# Patient Record
Sex: Male | Born: 1945
Health system: Southern US, Community
[De-identification: ages and names within clinical notes are randomized; demographics above are authoritative.]

## PROBLEM LIST (undated history)

## (undated) DIAGNOSIS — R112 Nausea with vomiting, unspecified: Secondary | ICD-10-CM

## (undated) DIAGNOSIS — I10 Essential (primary) hypertension: Secondary | ICD-10-CM

## (undated) DIAGNOSIS — N189 Chronic kidney disease, unspecified: Secondary | ICD-10-CM

## (undated) DIAGNOSIS — M797 Fibromyalgia: Secondary | ICD-10-CM

## (undated) DIAGNOSIS — R519 Headache, unspecified: Secondary | ICD-10-CM

## (undated) DIAGNOSIS — K219 Gastro-esophageal reflux disease without esophagitis: Secondary | ICD-10-CM

## (undated) DIAGNOSIS — J309 Allergic rhinitis, unspecified: Secondary | ICD-10-CM

## (undated) DIAGNOSIS — R51 Headache: Secondary | ICD-10-CM

## (undated) DIAGNOSIS — Z9889 Other specified postprocedural states: Secondary | ICD-10-CM

## (undated) DIAGNOSIS — M1990 Unspecified osteoarthritis, unspecified site: Secondary | ICD-10-CM

## (undated) HISTORY — PX: ADENOIDECTOMY: SUR15

## (undated) HISTORY — PX: APPENDECTOMY: SHX54

## (undated) HISTORY — PX: SINOSCOPY: SHX187

## (undated) HISTORY — PX: COLON SURGERY: SHX602

## (undated) HISTORY — PX: JOINT REPLACEMENT: SHX530

## (undated) HISTORY — PX: CERVICAL FUSION: SHX112

## (undated) HISTORY — PX: OTHER SURGICAL HISTORY: SHX169

## (undated) HISTORY — PX: CHOLECYSTECTOMY: SHX55

## (undated) HISTORY — PX: SHOULDER SURGERY: SHX246

## (undated) HISTORY — PX: TONSILLECTOMY: SUR1361

## (undated) HISTORY — DX: Allergic rhinitis, unspecified: J30.9

## (undated) HISTORY — PX: HERNIA REPAIR: SHX51

---

## 1983-01-27 HISTORY — PX: HEMORRHOID SURGERY: SHX153

## 1997-07-17 ENCOUNTER — Ambulatory Visit (HOSPITAL_COMMUNITY): Admission: RE | Admit: 1997-07-17 | Discharge: 1997-07-18 | Payer: Self-pay

## 1997-12-24 ENCOUNTER — Ambulatory Visit (HOSPITAL_COMMUNITY): Admission: RE | Admit: 1997-12-24 | Discharge: 1997-12-24 | Payer: Self-pay | Admitting: Family Medicine

## 1997-12-24 ENCOUNTER — Encounter: Payer: Self-pay | Admitting: Family Medicine

## 1999-09-25 ENCOUNTER — Encounter: Payer: Self-pay | Admitting: *Deleted

## 1999-09-25 ENCOUNTER — Encounter: Admission: RE | Admit: 1999-09-25 | Discharge: 1999-09-25 | Payer: Self-pay | Admitting: *Deleted

## 2000-06-28 ENCOUNTER — Ambulatory Visit (HOSPITAL_COMMUNITY): Admission: RE | Admit: 2000-06-28 | Discharge: 2000-06-28 | Payer: Self-pay | Admitting: Family Medicine

## 2000-06-28 ENCOUNTER — Encounter: Payer: Self-pay | Admitting: Family Medicine

## 2002-03-10 ENCOUNTER — Encounter: Payer: Self-pay | Admitting: *Deleted

## 2002-03-10 ENCOUNTER — Ambulatory Visit (HOSPITAL_COMMUNITY): Admission: RE | Admit: 2002-03-10 | Discharge: 2002-03-10 | Payer: Self-pay | Admitting: *Deleted

## 2002-03-14 ENCOUNTER — Encounter (INDEPENDENT_AMBULATORY_CARE_PROVIDER_SITE_OTHER): Payer: Self-pay | Admitting: Specialist

## 2002-03-14 ENCOUNTER — Ambulatory Visit (HOSPITAL_COMMUNITY): Admission: RE | Admit: 2002-03-14 | Discharge: 2002-03-14 | Payer: Self-pay | Admitting: Gastroenterology

## 2002-09-28 ENCOUNTER — Encounter: Payer: Self-pay | Admitting: Urology

## 2002-10-11 ENCOUNTER — Encounter (INDEPENDENT_AMBULATORY_CARE_PROVIDER_SITE_OTHER): Payer: Self-pay | Admitting: *Deleted

## 2002-10-11 ENCOUNTER — Inpatient Hospital Stay (HOSPITAL_COMMUNITY): Admission: RE | Admit: 2002-10-11 | Discharge: 2002-10-13 | Payer: Self-pay | Admitting: Urology

## 2004-01-27 HISTORY — PX: PROSTATE SURGERY: SHX751

## 2007-07-06 ENCOUNTER — Encounter: Admission: RE | Admit: 2007-07-06 | Discharge: 2007-07-06 | Payer: Self-pay | Admitting: Orthopaedic Surgery

## 2008-02-17 ENCOUNTER — Encounter: Admission: RE | Admit: 2008-02-17 | Discharge: 2008-02-17 | Payer: Self-pay | Admitting: Orthopaedic Surgery

## 2009-01-26 HISTORY — PX: MENISCUS REPAIR: SHX5179

## 2009-02-01 ENCOUNTER — Inpatient Hospital Stay (HOSPITAL_COMMUNITY): Admission: RE | Admit: 2009-02-01 | Discharge: 2009-02-06 | Payer: Self-pay | Admitting: Orthopaedic Surgery

## 2010-02-15 ENCOUNTER — Encounter: Payer: Self-pay | Admitting: Family Medicine

## 2010-04-13 LAB — URINALYSIS, ROUTINE W REFLEX MICROSCOPIC
Ketones, ur: NEGATIVE mg/dL
Nitrite: NEGATIVE
Nitrite: NEGATIVE
Specific Gravity, Urine: 1.013 (ref 1.005–1.030)
Specific Gravity, Urine: 1.02 (ref 1.005–1.030)
Urobilinogen, UA: 0.2 mg/dL (ref 0.0–1.0)
Urobilinogen, UA: 1 mg/dL (ref 0.0–1.0)
pH: 6 (ref 5.0–8.0)
pH: 7.5 (ref 5.0–8.0)

## 2010-04-13 LAB — URINE MICROSCOPIC-ADD ON

## 2010-04-13 LAB — BASIC METABOLIC PANEL
BUN: 10 mg/dL (ref 6–23)
BUN: 10 mg/dL (ref 6–23)
CO2: 29 mEq/L (ref 19–32)
Calcium: 8.3 mg/dL — ABNORMAL LOW (ref 8.4–10.5)
Calcium: 8.5 mg/dL (ref 8.4–10.5)
Creatinine, Ser: 0.92 mg/dL (ref 0.4–1.5)
Creatinine, Ser: 0.95 mg/dL (ref 0.4–1.5)
GFR calc Af Amer: >60 mL/min (ref >60)
GFR calc Af Amer: >60 mL/min (ref >60)
GFR calc non Af Amer: >60 mL/min (ref >60)
GFR calc non Af Amer: >60 mL/min (ref >60)
GFR calc non Af Amer: >60 mL/min (ref >60)
GFR calc non Af Amer: >60 mL/min (ref >60)
Glucose, Bld: 129 mg/dL — ABNORMAL HIGH (ref 70–99)
Potassium: 4.1 mEq/L (ref 3.5–5.1)
Potassium: 4.3 mEq/L (ref 3.5–5.1)
Potassium: 4.4 mEq/L (ref 3.5–5.1)
Sodium: 136 mEq/L (ref 135–145)
Sodium: 137 mEq/L (ref 135–145)

## 2010-04-13 LAB — CBC
HCT: 30.3 % — ABNORMAL LOW (ref 39.0–52.0)
HCT: 34.8 % — ABNORMAL LOW (ref 39.0–52.0)
HCT: 42.2 % (ref 39.0–52.0)
Hemoglobin: 11.2 g/dL — ABNORMAL LOW (ref 13.0–17.0)
Hemoglobin: 14.3 g/dL (ref 13.0–17.0)
MCV: 88.6 fL (ref 78.0–100.0)
Platelets: 164 10*3/uL (ref 150–400)
Platelets: 215 10*3/uL (ref 150–400)
RBC: 3.67 MIL/uL — ABNORMAL LOW (ref 4.22–5.81)
RDW: 14.3 % (ref 11.5–15.5)
WBC: 8.8 10*3/uL (ref 4.0–10.5)
WBC: 9.1 10*3/uL (ref 4.0–10.5)
WBC: 9.2 10*3/uL (ref 4.0–10.5)
WBC: 9.2 10*3/uL (ref 4.0–10.5)

## 2010-04-13 LAB — PROTIME-INR
INR: 1.18 (ref 0.00–1.49)
INR: 1.37 (ref 0.00–1.49)
INR: 1.74 — ABNORMAL HIGH (ref 0.00–1.49)
Prothrombin Time: 14.9 seconds (ref 11.6–15.2)
Prothrombin Time: 16.8 seconds — ABNORMAL HIGH (ref 11.6–15.2)
Prothrombin Time: 17.9 seconds — ABNORMAL HIGH (ref 11.6–15.2)
Prothrombin Time: 20.2 seconds — ABNORMAL HIGH (ref 11.6–15.2)

## 2010-04-13 LAB — APTT: aPTT: 30 seconds (ref 24–37)

## 2010-06-13 NOTE — Op Note (Signed)
NAME:  Oscar Ward, Oscar Ward                           ACCOUNT NO.:  [id number]   MEDICAL RECORD NO.:  [id number]                   PATIENT TYPE:  AMB   LOCATION:  ENDO                                 FACILITY:  [facility]   PHYSICIAN:  Petra Kuba, M.D.                 DATE OF BIRTH:  [birth year]   DATE OF PROCEDURE:  03/14/2002  DATE OF DISCHARGE:                                 OPERATIVE REPORT   PROCEDURE:  Colonoscopy.   INDICATIONS FOR PROCEDURE:  A patient with a history of surgery of remaining  colon, due for repeat screening.  Consent was signed after risks, benefits, methods, and options were  thoroughly discussed in the office.   MEDICINES USED:  Demerol 75 mg, Versed 6 mg.   DESCRIPTION OF PROCEDURE:  Rectal inspection was pertinent for external  hemorrhoids, small. Digital exam was negative. The pediatric video  adjustable colonoscope was inserted, easily advanced around the colon to the  cecum. This did not require any abdominal pressure or any position changes.  No obvious abnormality was seen on insertion. The scope was inserted a short  ways into the terminal ileum which was normal. Photo documentation was  obtained, the scope was slowly withdrawn. The prep was adequate. There was  some liquid stool that required washing and suctioning. On slow withdrawal  through the colon, the cecum, ascending and transverse was normal. The  surgical changes were probably in the distal sigmoid but hard to say for  sure. He had some difficulty holding air so tiny lesions on the left side  could have been missed but none were seen except for two tiny hyperplastic  appearing rectal and distal sigmoid polyps which were cold biopsied x1.  The  scope was retroflexed pertinent for some internal hemorrhoids. The scope was  straightened and readvanced a short ways up the left side of the colon, no  additional findings were seen. Air was suctioned, scope removed. The patient  tolerated the  procedure well. There was no obvious or immediate  complications.   ENDOSCOPIC DIAGNOSES:  1. Internal and external hemorrhoids.  2. Two tiny hyperplastic appearing rectal and sigmoid probably hyperplastic     polyps cold biopsied.  3. Otherwise within normal limits to the terminal ileum with difficulty     holding air with the scope on the left side.   PLAN:  Await pathology. Probably recheck colon screening in five years.  Happy to see back p.r.n., otherwise, return care to Dr. Tiburcio Pea for the  customary health care maintenance to include yearly rectals and guaiacs.                                               Petra Kuba, M.D.   MEM/MEDQ  D:  03/14/2002  T:  03/14/2002  Job:  [id number]   cc:   Melida Quitter, M.D.  [address]  [place]  [place] [address]  Fax: [phone]

## 2010-06-13 NOTE — Consult Note (Signed)
   NAME:  Oscar Ward, Oscar Ward                           ACCOUNT NO.:  [id number]   MEDICAL RECORD NO.:  [id number]                   PATIENT TYPE:  INP   LOCATION:  0003                                 FACILITY:  [facility]   PHYSICIAN:  Maretta Bees. Vonita Moss, M.D.             DATE OF BIRTH:  [birth year]   DATE OF CONSULTATION:  10/11/2002  DATE OF DISCHARGE:                                   CONSULTATION   This 65 year old white male has had a long history of voiding difficulties  with frequency and nocturia x2 and a slow stream.  He has been treated with  Flomax with some improvement and was on Cardura at some time, but he had a  paradoxical increase in his blood pressure.  He has had continued symptoms  despite medical therapy.  His prostate was not large enough to warrant  Proscar therapy, and the patient was counseled both by myself and Dr. Annabell Howells  about invasive procedures, and he opted for TUR of the prostate, having been  counseled about hemorrhage, infection, stricture formation, low risk of  incontinence, retrograde ejaculations, small risk of impotency, or  persistent symptomatology.  He was told about a 2-day hospital stay in all  likelihood.   PAST HISTORY:   PAST MEDICAL HISTORY:  1. A solitary left kidney.  2. He has had migraine headaches.   PAST SURGICAL HISTORY:  1. Hemorrhoidectomy.  2. Tonsillectomy.  3. Hernia repair.  4. Partial colectomy for megacolon.  5. Right orchiectomy.  6. Ear surgery.  7. Nasal surgery.   MEDICATIONS:  1. Vioxx 25 mg daily.  2. Lovastatin 20 mg daily.  3. Isosit p.r.n. migraine headaches.  4. He just finished a Z-Pak for bronchitis.   ALLERGIES:  HE CANNOT TOLERATE CODEINE BECAUSE OF CONSTIPATION.   SOCIAL HISTORY:  He does not smoke or drink alcohol.   FAMILY HISTORY:  Noncontributory.   REVIEW OF SYSTEMS:  As noted.   PHYSICAL EXAMINATION:  VITAL SIGNS:  Blood pressure 164/96, pulse 68,  temperature 98.3.  GENERAL:  He is alert and  oriented.  SKIN:  Warm and dry.  CHEST:  Clear.  ABDOMEN:  Soft and nontender.  EXTERNAL GENITALIA:  Absent right testicle.  RECTAL:  Prostate feels small and benign.   IMPRESSION:  1. Benign prostatic hypertrophy and bladder outlet obstruction symptoms.  2. Migraine headaches.  3. Hypercholesterolemia.   PLAN:  TUR the prostate.                                                 Maretta Bees. Vonita Moss, M.D.    LJP/MEDQ  D:  10/11/2002  T:  10/11/2002  Job:  [id number]

## 2010-06-13 NOTE — Discharge Summary (Signed)
   NAME:  Oscar Ward, Oscar Ward                           ACCOUNT NO.:  [id number]   MEDICAL RECORD NO.:  [id number]                   PATIENT TYPE:  INP   LOCATION:  0345                                 FACILITY:  [facility]   PHYSICIAN:  Maretta Bees. Vonita Moss, M.D.             DATE OF BIRTH:  [birth year]   DATE OF ADMISSION:  10/11/2002  DATE OF DISCHARGE:  10/13/2002                                 DISCHARGE SUMMARY   FINAL DIAGNOSES:  1. Benign prostatic hypertrophy and bladder outlet obstructive symptoms.  2. Migraine headaches.  3. Hypercholesterolemia.   PROCEDURE:  Transurethral resection of prostate on October 11, 2002.   HISTORY:  This is a 65 year old white male who has had a long history of  bladder outlet obstructive symptoms.  He was treated with Flomax with some  improvement, and has been previously treated with Cardura with paradoxical  improvement in his blood pressure.  His prostate was very small __________  candidate for Proscar therapy.  __________ with Coreg.  Dr. __________ and I  both recommended TUR of the prostate, trying to relieve the symptoms.   In general good health.  He does have a solitary left kidney.  No __________  headache and hypercholesterolemia.   PHYSICAL EXAMINATION:  Noncontributory.   HOSPITAL COURSE:  After admission, he underwent TUR of the prostate.  __________ benign.  His urine remained clear.  His Foley was removed on  October 13, 2002.  Final pathology showed no cancer tissue.  Voiding with  slight dysuria and some frequency, but felt like he emptied his bladder well  on the day of discharge.  He will go home and continue on his usual  medications of Vioxx, Ultracet, and migraine therapy.  He was sent home in  good condition.   He will return to the office in two weeks for followup.  He is on light  activities for a month, and he was advised about the possibility of  hematuria and  __________ .       Maretta Bees. Vonita Moss, M.D.    LJP/MEDQ  D:  10/13/2002  T:  10/13/2002  Job:  [id number]

## 2010-06-13 NOTE — Op Note (Signed)
   NAME:  Oscar Ward, Oscar Ward                           ACCOUNT NO.:  [id number]   MEDICAL RECORD NO.:  [id number]                   PATIENT TYPE:  INP   LOCATION:  0003                                 FACILITY:  [facility]   PHYSICIAN:  Maretta Bees. Vonita Moss, M.D.             DATE OF BIRTH:  1945/08/17   DATE OF PROCEDURE:  10/11/2002  DATE OF DISCHARGE:                                 OPERATIVE REPORT   PREOPERATIVE DIAGNOSIS:  Benign prostatic hypertrophy and prostatism.   POSTOPERATIVE DIAGNOSIS:  Benign prostatic hypertrophy and prostatism.   OPERATION/PROCEDURE:  Transurethral resection of the prostate.   SURGEON:  Maretta Bees. Vonita Moss, M.D.   ANESTHESIA:  General.   INDICATIONS:  This 65 year old white male has had a long history of voiding  symptoms unresponsive to medical therapy.  His prostate is small and would  not benefit by Proscar.  He was counseled about invasive procedures and he  chose TUR of the prostate.  He had a flow rate showing low flow and he does  have a history of a solitary left kidney.   DESCRIPTION OF PROCEDURE:  The patient was brought to the operating room,  placed in the lithotomy position.  External genitalia prepped and draped in  the usual fashion.  The #24 through 30-French [employer] sounds were passed  to the bladder.  He had a very mild meatal stenosis.  The 28-French  resectoscope sheath was inserted.  The bladder had mild trabeculation and a  solitary left ureteral orifice.  He had a very high bladder neck and small  lateral lobes.  The bladder neck was resected first which brought it wide  open when he was resected at 6 o'clock.  The lateral lobe tissue was  resected down the capsule.  This created a well excavated, open prostate.  He had very minimal bleeding.  Hemostasis was obtained by use of  electrocautery.  Residual chips were removed from the bladder at this point.  There was good hemostasis and well excavated prostatic fossa and the  solitary left  ureteral orifice was intact.  The scope was removed.  External  sphincter was seen to be intact.  The 24-French, 30 mL Foley was inserted  and connected to closed drainage with clear irrigation.  He was taken to the  recovery room in good condition.                                                 Maretta Bees. Vonita Moss, M.D.    LJP/MEDQ  D:  10/11/2002  T:  10/11/2002  Job:  [id number]

## 2012-02-12 ENCOUNTER — Other Ambulatory Visit (HOSPITAL_COMMUNITY): Payer: Self-pay | Admitting: Family Medicine

## 2012-02-12 ENCOUNTER — Ambulatory Visit (HOSPITAL_COMMUNITY)
Admission: RE | Admit: 2012-02-12 | Discharge: 2012-02-12 | Disposition: A | Discharge status: Home / Self Care | Payer: BC Managed Care – PPO | Source: Ambulatory Visit | Attending: Family Medicine | Admitting: Family Medicine

## 2012-02-12 DIAGNOSIS — R52 Pain, unspecified: Secondary | ICD-10-CM

## 2012-02-12 DIAGNOSIS — M7989 Other specified soft tissue disorders: Secondary | ICD-10-CM | POA: Insufficient documentation

## 2012-02-12 DIAGNOSIS — M79609 Pain in unspecified limb: Secondary | ICD-10-CM | POA: Insufficient documentation

## 2012-02-12 DIAGNOSIS — R609 Edema, unspecified: Secondary | ICD-10-CM

## 2012-02-12 NOTE — Progress Notes (Signed)
VASCULAR LAB PRELIMINARY  PRELIMINARY  PRELIMINARY  PRELIMINARY  Left lower extremity venous duplex completed.    Preliminary report:  Left:  No evidence of DVT, superficial thrombosis, or Baker's cyst.  Trace amounts of interstitial fluid noted in the posterior distal thigh at the area of most pain.  Davison Ohms, RVT 02/12/2012, 2:18 PM

## 2013-05-31 ENCOUNTER — Other Ambulatory Visit (HOSPITAL_COMMUNITY): Payer: Self-pay | Admitting: Family Medicine

## 2013-05-31 DIAGNOSIS — R0602 Shortness of breath: Secondary | ICD-10-CM

## 2013-06-15 ENCOUNTER — Telehealth (HOSPITAL_COMMUNITY): Payer: Self-pay

## 2013-06-16 ENCOUNTER — Ambulatory Visit (HOSPITAL_COMMUNITY)
Admission: RE | Admit: 2013-06-16 | Discharge: 2013-06-16 | Disposition: A | Discharge status: Home / Self Care | Payer: 59 | Source: Ambulatory Visit | Attending: Cardiovascular Disease | Admitting: Cardiovascular Disease

## 2013-06-16 DIAGNOSIS — R0602 Shortness of breath: Secondary | ICD-10-CM

## 2013-06-21 ENCOUNTER — Encounter (HOSPITAL_COMMUNITY): Payer: BC Managed Care – PPO

## 2013-08-03 NOTE — Telephone Encounter (Signed)
Encounter complete. 

## 2014-11-15 ENCOUNTER — Encounter: Payer: Self-pay | Admitting: Allergy and Immunology

## 2014-11-15 ENCOUNTER — Ambulatory Visit (INDEPENDENT_AMBULATORY_CARE_PROVIDER_SITE_OTHER): Payer: Medicare Other | Admitting: Allergy and Immunology

## 2014-11-15 VITALS — Ht 71.5 in | Wt 189.6 lb

## 2014-11-15 DIAGNOSIS — G4489 Other headache syndrome: Secondary | ICD-10-CM

## 2014-11-15 DIAGNOSIS — J387 Other diseases of larynx: Secondary | ICD-10-CM

## 2014-11-15 DIAGNOSIS — K219 Gastro-esophageal reflux disease without esophagitis: Secondary | ICD-10-CM

## 2014-11-15 DIAGNOSIS — J3089 Other allergic rhinitis: Secondary | ICD-10-CM

## 2014-11-15 NOTE — Patient Instructions (Signed)
  1. Continue montelukast 10mg  and OTC Rhinocort  2. Continue Pantoprazole and Ranitidine  3. Continue nasal saline and cetirizine if needed  4. Return in 8 weeks

## 2014-11-15 NOTE — Progress Notes (Signed)
[facility] [place] [place]  Follow-up Note    Subjective:   Oscar CharonBennie F Tesler Jr. is a 69 y.o. male who [facility] to the [facility] in re-evaluation of the following:  HPI Comments:  Oscar Ward returns to this clinic in reevaluation of his allergic rhinitis and gastric reflux disease and cephalgia. He is resolved off his issues. His throat is a lot better. His nose is lot better. He has no reflux. He has no headache. He's been consistently using montelukast and Rhinocort. He is also continued to use  pantoprazole and ranitidine. He is very satisfied with the response he is received.    Current outpatient prescriptions:  .  atorvastatin (LIPITOR) 20 MG tablet, Take 20 mg by mouth daily., Disp: , Rfl: 8 .  budesonide (RHINOCORT ALLERGY) 32 MCG/ACT nasal spray, Place 1 spray into both nostrils daily., Disp: , Rfl:  .  cetirizine (ZYRTEC) 10 MG tablet, Take 10 mg by mouth daily., Disp: , Rfl:  .  lisinopril (PRINIVIL,ZESTRIL) 10 MG tablet, Take 10 mg by mouth daily., Disp: , Rfl: 1 .  meloxicam (MOBIC) 15 MG tablet, Take 15 mg by mouth daily., Disp: , Rfl: 5 .  montelukast (SINGULAIR) 10 MG tablet, Take 10 mg by mouth daily., Disp: , Rfl: 3 .  oxyCODONE-acetaminophen (PERCOCET) 7.5-325 MG tablet, Take 325 tablets by mouth daily., Disp: , Rfl: 0 .  pantoprazole (PROTONIX) 40 MG tablet, Take 40 mg by mouth daily., Disp: , Rfl: 5 .  ranitidine (ZANTAC) 300 MG tablet, Take 300 mg by mouth every evening., Disp: , Rfl: 1 .  senna (SENOKOT) 8.6 MG tablet, Take 1 tablet by mouth daily., Disp: , Rfl:   No orders of the defined types were placed in this encounter.    No past medical history on file.  Past Surgical History  Procedure Laterality Date  . Adenoidectomy    . Tonsillectomy    . Sinoscopy      Allergies no known allergies  Review of Systems  Constitutional: Negative.   HENT: Negative.   Eyes: Negative.   Respiratory: Negative.    Cardiovascular: Negative.   Gastrointestinal: Negative.   Skin: Negative.      Objective:   There were no vitals filed for this visit.  Physical Exam  Diagnostics: None   Assessment and Plan:   1. Other allergic rhinitis   2. LPRD (laryngopharyngeal reflux disease)   3. Other headache syndrome     1. Continue montelukast 10mg  and OTC Rhinocort  2. Continue Pantoprazole and Ranitidine  3. Continue nasal saline and cetirizine if needed  4. Return in 8 weeks  Overall Oscar Ward has done great. We'll keep him on this plan for a full 12 weeks. At that point in time we will see if we can consolidate his treatment.     Oscar SchimkeEric Kozlow, MD [facility]

## 2014-12-06 ENCOUNTER — Other Ambulatory Visit: Payer: Self-pay

## 2014-12-06 MED ORDER — RANITIDINE HCL 300 MG PO TABS: 300.0000 mg | ORAL_TABLET | Freq: Every evening | ORAL | Status: DC

## 2015-01-10 ENCOUNTER — Ambulatory Visit (INDEPENDENT_AMBULATORY_CARE_PROVIDER_SITE_OTHER): Payer: Medicare Other | Admitting: Allergy and Immunology

## 2015-01-10 ENCOUNTER — Encounter: Payer: Self-pay | Admitting: Allergy and Immunology

## 2015-01-10 VITALS — BP 110/60 | HR 64 | Resp 16

## 2015-01-10 DIAGNOSIS — H1010 Acute atopic conjunctivitis, unspecified eye: Secondary | ICD-10-CM | POA: Diagnosis not present

## 2015-01-10 DIAGNOSIS — G4489 Other headache syndrome: Secondary | ICD-10-CM | POA: Insufficient documentation

## 2015-01-10 DIAGNOSIS — J387 Other diseases of larynx: Secondary | ICD-10-CM

## 2015-01-10 DIAGNOSIS — J309 Allergic rhinitis, unspecified: Secondary | ICD-10-CM

## 2015-01-10 DIAGNOSIS — K219 Gastro-esophageal reflux disease without esophagitis: Secondary | ICD-10-CM | POA: Insufficient documentation

## 2015-01-10 NOTE — Progress Notes (Signed)
[facility] [place]  Follow-up Note  Refering Provider: Johny Blamer, MD Primary Provider: Johny Blamer, MD  Subjective:   Oscar Collard. is a 69 y.o. male who returns to the [facility] in re-evaluation of the following:  HPI Comments:  Oscar Ward returns to this clinic on 01/10/2015 in evaluation of his allergic rhinitis, LPR, and headache. While consistently using montelukast and Rhinocort he has not had any problems with his upper airways. While consistently using pantoprazole and ranitidine he has not had any problems with reflux or problems with his throat. However, he has once again redeveloped headaches with a frequency of twice a week. He relies on the use of Fioricet and Mucinex D to alleviate these headaches twice a week. He does continue to drink caffeine. He drinks 2 cups of coffee per day. He apparently had a head CT scan at [facility] the past month which was normal.   Outpatient Encounter Prescriptions as of 01/10/2015  Medication Sig  . atorvastatin (LIPITOR) 20 MG tablet Take 20 mg by mouth daily.  . budesonide (RHINOCORT ALLERGY) 32 MCG/ACT nasal spray Place 1 spray into both nostrils daily.  . cetirizine (ZYRTEC) 10 MG tablet Take 10 mg by mouth daily.  Marland Kitchen lisinopril (PRINIVIL,ZESTRIL) 10 MG tablet Take 10 mg by mouth daily.  . meloxicam (MOBIC) 15 MG tablet Take 15 mg by mouth daily.  . montelukast (SINGULAIR) 10 MG tablet Take 10 mg by mouth daily.  . OIL OF OREGANO PO Take 1 tablet by mouth daily.  Marland Kitchen oxyCODONE-acetaminophen (PERCOCET) 7.5-325 MG tablet Take 325 tablets by mouth daily.  . pantoprazole (PROTONIX) 40 MG tablet Take 40 mg by mouth daily.  . ranitidine (ZANTAC) 300 MG tablet Take 1 tablet (300 mg total) by mouth every evening.  . senna (SENOKOT) 8.6 MG tablet Take 1 tablet by mouth daily.   No facility-administered encounter medications on file as of 01/10/2015.    No orders  of the defined types were placed in this encounter.    History reviewed. No pertinent past medical history.  Past Surgical History  Procedure Laterality Date  . Adenoidectomy    . Tonsillectomy    . Sinoscopy      No Known Allergies  Review of Systems  Constitutional: Negative for fever, chills and fatigue.  HENT: Negative for congestion, ear pain, facial swelling, hearing loss, nosebleeds, postnasal drip, rhinorrhea, sinus pressure, sneezing, sore throat, tinnitus, trouble swallowing and voice change.   Eyes: Negative for pain, discharge, redness and itching.  Respiratory: Negative for cough, chest tightness, shortness of breath and wheezing.   Cardiovascular: Negative for chest pain and leg swelling.  Gastrointestinal: Negative for nausea, vomiting and abdominal pain.  Musculoskeletal: Negative for myalgias and arthralgias.  Skin: Negative for rash.  Allergic/Immunologic: Negative.   Neurological: Positive for headaches. Negative for dizziness.  Hematological: Negative for adenopathy. Does not bruise/bleed easily.     Objective:   Filed Vitals:   01/10/15 1006  BP: 110/60  Pulse: 64  Resp: 16          Physical Exam  Constitutional: He appears well-developed and well-nourished. No distress.  HENT:  Head: Normocephalic and atraumatic. Head is without right periorbital erythema and without left periorbital erythema.  Right Ear: Tympanic membrane, external ear and ear canal normal. No drainage or tenderness. No foreign bodies. Tympanic membrane is not injected, not scarred, not perforated, not erythematous, not retracted and not bulging. No middle  ear effusion.  Left Ear: Tympanic membrane, external ear and ear canal normal. No drainage or tenderness. No foreign bodies. Tympanic membrane is not injected, not scarred, not perforated, not erythematous, not retracted and not bulging.  No middle ear effusion.  Nose: Nose normal. No mucosal edema, rhinorrhea, nose lacerations  or sinus tenderness.  No foreign bodies.  Mouth/Throat: Oropharynx is clear and moist. No oropharyngeal exudate, posterior oropharyngeal edema, posterior oropharyngeal erythema or tonsillar abscesses.  Bilateral hearing aids  Eyes: Lids are normal. Right eye exhibits no chemosis, no discharge and no exudate. No foreign body present in the right eye. Left eye exhibits no chemosis, no discharge and no exudate. No foreign body present in the left eye. Right conjunctiva is not injected. Left conjunctiva is not injected.  Neck: Neck supple. No tracheal tenderness present. No tracheal deviation and no edema present. No thyroid mass and no thyromegaly present.  Cardiovascular: Normal rate, regular rhythm, S1 normal and S2 normal.  Exam reveals no gallop.   No murmur heard. Pulmonary/Chest: No accessory muscle usage or stridor. No respiratory distress. He has no wheezes. He has no rhonchi. He has no rales.  Abdominal: Soft.  Lymphadenopathy:       Head (right side): No tonsillar adenopathy present.       Head (left side): No tonsillar adenopathy present.    He has no cervical adenopathy.  Neurological: He is alert.  Skin: No rash noted. He is not diaphoretic.  Psychiatric: He has a normal mood and affect. His behavior is normal.    Diagnostics: None  Assessment and Plan:   1. Allergic rhinoconjunctivitis   2. LPRD (laryngopharyngeal reflux disease)   3. Other headache syndrome      1. Continue montelukast 10mg  daily and OTC Rhinocort 3-7 times per week.  2. Continue Pantoprazole and Ranitidine  3. Continue nasal saline and cetirizine if needed  4. Slowly taper off all caffeine and chocolate  5. [facility]  6. Return in 6 months or earlier if problem  I will have Neils slowly taper off his caffeine in the hope of minimizing his headaches in the future. He will continue to use pantoprazole and ranitidine as well as montelukast and Rhinocort for an additional  6 months. We'll see what happens as we progress through this upcoming springtime season and we'll make a decision about how to proceed pending his response to medical therapy described above. Certainly if his headaches increase in frequency or intensity he'll require further evaluation.     Laurette SchimkeEric Kozlow, MD [facility]

## 2015-01-10 NOTE — Patient Instructions (Signed)
  1. Continue montelukast 10mg  daily and OTC Rhinocort 3-7 times per week.  2. Continue Pantoprazole and Ranitidine  3. Continue nasal saline and cetirizine if needed  4. Slowly taper off all caffeine and chocolate  5. [facility]  6. Return in 6 months or earlier if problem

## 2015-04-24 ENCOUNTER — Other Ambulatory Visit: Payer: Self-pay | Admitting: *Deleted

## 2015-04-24 ENCOUNTER — Telehealth: Payer: Self-pay | Admitting: Allergy and Immunology

## 2015-04-24 MED ORDER — AZELASTINE HCL 0.1 % NA SOLN: NASAL | Status: DC

## 2015-04-24 NOTE — Telephone Encounter (Signed)
Can add Azelastine 1-2 sprays each nostril one -two times per day prn

## 2015-04-24 NOTE — Telephone Encounter (Signed)
Having trouble with pollen. The medication he is currently taken is not helping. Can something be called in or should he come in for an appointment?  [facility] in [place].

## 2015-04-24 NOTE — Telephone Encounter (Signed)
[facility]

## 2015-05-04 ENCOUNTER — Other Ambulatory Visit: Payer: Self-pay | Admitting: Allergy and Immunology

## 2015-05-23 ENCOUNTER — Other Ambulatory Visit: Payer: Self-pay | Admitting: Allergy and Immunology

## 2015-07-11 ENCOUNTER — Encounter: Payer: Self-pay | Admitting: Allergy and Immunology

## 2015-07-11 ENCOUNTER — Ambulatory Visit (INDEPENDENT_AMBULATORY_CARE_PROVIDER_SITE_OTHER): Payer: Medicare Other | Admitting: Allergy and Immunology

## 2015-07-11 VITALS — BP 120/86 | HR 80 | Resp 16

## 2015-07-11 DIAGNOSIS — G4489 Other headache syndrome: Secondary | ICD-10-CM | POA: Diagnosis not present

## 2015-07-11 DIAGNOSIS — J309 Allergic rhinitis, unspecified: Secondary | ICD-10-CM

## 2015-07-11 DIAGNOSIS — H1010 Acute atopic conjunctivitis, unspecified eye: Secondary | ICD-10-CM

## 2015-07-11 DIAGNOSIS — K219 Gastro-esophageal reflux disease without esophagitis: Secondary | ICD-10-CM

## 2015-07-11 DIAGNOSIS — J387 Other diseases of larynx: Secondary | ICD-10-CM | POA: Diagnosis not present

## 2015-07-11 NOTE — Patient Instructions (Addendum)
  1. Continue montelukast 10mg  daily and Astelin 2 sprays each nostril twice a day  2. Continue Pantoprazole and Ranitidine  3. Continue nasal saline and cetirizine if needed  4. Continue to remain off all caffeine and chocolate  5. [facility]  6. Return in 6 months or earlier if problem

## 2015-07-11 NOTE — Progress Notes (Signed)
Follow-up Note  Referring Provider: Johny BlamerHarris, William, MD Primary Provider: Johny BlamerHARRIS, WILLIAM, MD Date of Office Visit: 07/11/2015  Subjective:   Oscar CharonBennie F Filter Jr. (DOB: 05/31/1945) is a 70 y.o. male who [facility] in re-evaluation of the following:  HPI: Oscar Ward returns to this clinic in reevaluation of his allergic rhinitis and LPR and headaches. I've not seen him in his clinic since December 2016.  His upper airways are doing quite well now with a combination of nasal antihistamine and montelukast. He has not had to use an antibiotic to treat an episode of sinusitis although he did have one event when he was visiting in [place] and developed an episode of sinusitis. Apparently he had coughing with that episode and a chest x-ray was obtained which identified an abnormality and he had a follow-up CT scan which may have identified a nodule and he was evaluated by a pulmonologist who felt that he just needed to have a repeat CT scan in one year.  His reflux and throat issue are doing very well as long as he continues to use treatment for his reflux which includes at this point in time a proton pump inhibitor in the morning and an H2 receptor blocker in the evening. He does not drink any caffeine or eat any chocolate. He has elevated the head of his bed.  His headaches are still an issue as they have been for many many years. He still has a frequency of twice a week for whichshe will use Fioricet.    Medication List           atorvastatin 20 MG tablet  Commonly known as:  LIPITOR  Take 20 mg by mouth daily.     azelastine 0.1 % nasal spray  Commonly known as:  ASTELIN  USE ONE-TWO SPRAYS IN EACH NOSTRIL ONCE OR TWICE DAILY AS NEEDED     butalbital-acetaminophen-caffeine 50-325-40 MG tablet  Commonly known as:  FIORICET, ESGIC  Take 1 tablet by mouth as needed.     cetirizine 10 MG tablet  Commonly known as:  ZYRTEC  Take 10 mg by  mouth daily.     HYDROcodone-acetaminophen 10-325 MG tablet  Commonly known as:  NORCO  Take 1 tablet by mouth as needed.     losartan 50 MG tablet  Commonly known as:  COZAAR  Take 2 tablets by mouth daily.     meloxicam 15 MG tablet  Commonly known as:  MOBIC  Take 15 mg by mouth daily.     montelukast 10 MG tablet  Commonly known as:  SINGULAIR  Take 10 mg by mouth daily.     OIL OF OREGANO PO  Take 1 tablet by mouth daily.     ranitidine 300 MG tablet  Commonly known as:  ZANTAC  TAKE 1 TABLET BY MOUTH EVERY EVENING     senna 8.6 MG tablet  Commonly known as:  SENOKOT  Take 1 tablet by mouth daily.        History reviewed. No pertinent past medical history.  Past Surgical History  Procedure Laterality Date  . Adenoidectomy    . Tonsillectomy    . Sinoscopy      No Known Allergies  Review of systems negative except as noted in HPI / PMHx or noted below:  Review of Systems  Constitutional: Negative.   HENT: Negative.   Eyes: Negative.   Respiratory: Negative.   Cardiovascular: Negative.  Gastrointestinal: Negative.   Genitourinary: Negative.   Musculoskeletal: Negative.   Skin: Negative.   Neurological: Negative.   Endo/Heme/Allergies: Negative.   Psychiatric/Behavioral: Negative.      Objective:   Filed Vitals:   07/11/15 1056  BP: 120/86  Pulse: 80  Resp: 16          Physical Exam  Constitutional: He is well-developed, well-nourished, and in no distress.  HENT:  Head: Normocephalic.  Right Ear: Tympanic membrane, external ear and ear canal normal.  Left Ear: Tympanic membrane, external ear and ear canal normal.  Nose: Nose normal. No mucosal edema or rhinorrhea.  Mouth/Throat: Uvula is midline, oropharynx is clear and moist and mucous membranes are normal. No oropharyngeal exudate.  Eyes: Conjunctivae are normal.  Neck: Trachea normal. No tracheal tenderness present. No tracheal deviation present. No thyromegaly present.    Cardiovascular: Normal rate, regular rhythm, S1 normal, S2 normal and normal heart sounds.   No murmur heard. Pulmonary/Chest: Breath sounds normal. No stridor. No respiratory distress. He has no wheezes. He has no rales.  Musculoskeletal: He exhibits no edema.  Lymphadenopathy:       Head (right side): No tonsillar adenopathy present.       Head (left side): No tonsillar adenopathy present.    He has no cervical adenopathy.  Neurological: He is alert. Gait normal.  Skin: No rash noted. He is not diaphoretic. No erythema. Nails show no clubbing.  Psychiatric: Mood and affect normal.    Diagnostics: None    Assessment and Plan:   1. Allergic rhinoconjunctivitis   2. LPRD (laryngopharyngeal reflux disease)   3. Other headache syndrome     1. Continue montelukast  daily and OTC Astelin 2 sprays each nostril twice a day.  2. Continue Pantoprazole and Ranitidine  3. Continue nasal saline and cetirizine if needed  4. Continue to remain off all caffeine and chocolate  5. Review head CT from [facility]  6. Return in 6 months or earlier if problem  Audelia Acton appears to be doing relatively well and I'm not really going to change much of his medical therapy at this point in time. He will continue to use therapy directed against inflammation of his upper airway and continued to use therapy directed against reflux. I've asked him to follow-up with his primary care doctor if he would like any additional evaluation or therapy regarding his headaches. This appears to be a long-standing issue and he's been using Fioricet for many many years and he is very much satisfied with the response that he received on this form of therapy.  Laurette Schimke, MD [facility]

## 2015-08-15 ENCOUNTER — Other Ambulatory Visit: Payer: Self-pay | Admitting: Allergy and Immunology

## 2015-10-05 ENCOUNTER — Other Ambulatory Visit: Payer: Self-pay | Admitting: Allergy and Immunology

## 2015-10-07 ENCOUNTER — Other Ambulatory Visit: Payer: Self-pay | Admitting: Physician Assistant

## 2015-10-08 ENCOUNTER — Encounter (HOSPITAL_COMMUNITY)
Admission: RE | Admit: 2015-10-08 | Discharge: 2015-10-08 | Disposition: A | Discharge status: Home / Self Care | Payer: Medicare Other | Source: Ambulatory Visit | Attending: Orthopaedic Surgery | Admitting: Orthopaedic Surgery

## 2015-10-08 ENCOUNTER — Other Ambulatory Visit (HOSPITAL_COMMUNITY): Payer: Self-pay | Admitting: *Deleted

## 2015-10-08 ENCOUNTER — Encounter (HOSPITAL_COMMUNITY): Payer: Self-pay

## 2015-10-08 DIAGNOSIS — Z01818 Encounter for other preprocedural examination: Secondary | ICD-10-CM | POA: Diagnosis not present

## 2015-10-08 HISTORY — DX: Headache, unspecified: R51.9

## 2015-10-08 HISTORY — DX: Gastro-esophageal reflux disease without esophagitis: K21.9

## 2015-10-08 HISTORY — DX: Chronic kidney disease, unspecified: N18.9

## 2015-10-08 HISTORY — DX: Headache: R51

## 2015-10-08 HISTORY — DX: Nausea with vomiting, unspecified: R11.2

## 2015-10-08 HISTORY — DX: Fibromyalgia: M79.7

## 2015-10-08 HISTORY — DX: Essential (primary) hypertension: I10

## 2015-10-08 HISTORY — DX: Other specified postprocedural states: Z98.890

## 2015-10-08 HISTORY — DX: Unspecified osteoarthritis, unspecified site: M19.90

## 2015-10-08 LAB — BASIC METABOLIC PANEL
Anion gap: 7 (ref 5–15)
BUN: 11 mg/dL (ref 6–20)
CALCIUM: 9.3 mg/dL (ref 8.9–10.3)
CO2: 29 mmol/L (ref 22–32)
CREATININE: 1.49 mg/dL — AB (ref 0.61–1.24)
Chloride: 104 mmol/L (ref 101–111)
GFR, EST AFRICAN AMERICAN: 53 mL/min — AB (ref >60)
GFR, EST NON AFRICAN AMERICAN: 45 mL/min — AB (ref >60)
Glucose, Bld: 106 mg/dL — ABNORMAL HIGH (ref 65–99)
Potassium: 3.9 mmol/L (ref 3.5–5.1)
SODIUM: 140 mmol/L (ref 135–145)

## 2015-10-08 LAB — CBC
HCT: 40.3 % (ref 39.0–52.0)
Hemoglobin: 12.3 g/dL — ABNORMAL LOW (ref 13.0–17.0)
MCH: 24.9 pg — ABNORMAL LOW (ref 26.0–34.0)
MCHC: 30.5 g/dL (ref 30.0–36.0)
MCV: 81.6 fL (ref 78.0–100.0)
PLATELETS: 271 10*3/uL (ref 150–400)
RBC: 4.94 MIL/uL (ref 4.22–5.81)
RDW: 15.7 % — AB (ref 11.5–15.5)
WBC: 6.4 10*3/uL (ref 4.0–10.5)

## 2015-10-08 LAB — TYPE AND SCREEN
ABO/RH(D): O POS
Antibody Screen: NEGATIVE

## 2015-10-08 LAB — ABO/RH: ABO/RH(D): O POS

## 2015-10-08 LAB — SURGICAL PCR SCREEN
MRSA, PCR: NEGATIVE
STAPHYLOCOCCUS AUREUS: NEGATIVE

## 2015-10-08 NOTE — Pre-Procedure Instructions (Signed)
    Oscar CritchleyBennie F Calkin Jr.  10/08/2015      [facility] [phone] - [place], [place] - [address] AT [facility] [address] [place] [place] [address] Phone: [phone] Fax: [phone]    Your procedure is scheduled on 10-15-2015  Tuesday   [facility] Admitting at 1:00 PM    Call this number if you have problems the morning of surgery:  [phone]   Remember:  Do not eat food or drink liquids after midnight.   Take these medicines the morning of surgery with A SIP OF WATER Astelin nasal spray as needed,Fioricet if needed for headache,Hydrocodone(Norco) ,Protonix           STOP ASPIRIN,ANTIINFLAMATORIES (IBUPROFEN,ALEVE,MOTRIN,ADVIL,GOODY'S POWDERS),HERBAL SUPPLEMENTS,FISH OIL,AND VITAMINS 5-7 DAYS PRIOR TO SURGERY   Do not wear jewelry,   Do not wear lotions, powders, or perfumes, or deoderant.  Do not shave 48 hours prior to surgery.  Men may shave face and neck   Do not bring valuables to the hospital.  [facility] is not responsible for any belongings or valuables.  Contacts, dentures or bridgework may not be worn into surgery.  Leave your suitcase in the car.  After surgery it may be brought to your room.  For patients admitted to the hospital, discharge time will be determined by your treatment team.  Patients discharged the day of surgery will not be allowed to drive home.    Special instructions:  See attached Sheet for instructions on CHG showers  Please read over the following fact sheets that you were given. Coughing and Deep Breathing

## 2015-10-08 NOTE — Progress Notes (Signed)
[facility]  Requested Stress Test ,Last OV and Chest X-Ray from Dr. Durwin Noraandal Harris.

## 2015-10-14 MED ORDER — TRANEXAMIC ACID 1000 MG/10ML IV SOLN
1000.0000 mg | INTRAVENOUS | Status: AC
  Administered 2015-10-15: 1000 mg via INTRAVENOUS
  Filled 2015-10-14: qty 10

## 2015-10-14 MED ORDER — CEFAZOLIN SODIUM-DEXTROSE 2-4 GM/100ML-% IV SOLN
2.0000 g | INTRAVENOUS | Status: AC
  Administered 2015-10-15: 2 g via INTRAVENOUS
  Filled 2015-10-14: qty 100

## 2015-10-14 NOTE — Progress Notes (Signed)
Pt notified of time change.New arrival of 11:30.Pt verbalized understanding.

## 2015-10-15 ENCOUNTER — Encounter (HOSPITAL_COMMUNITY): Payer: Self-pay | Admitting: *Deleted

## 2015-10-15 ENCOUNTER — Inpatient Hospital Stay (HOSPITAL_COMMUNITY): Payer: Medicare Other | Admitting: Certified Registered"

## 2015-10-15 ENCOUNTER — Inpatient Hospital Stay (HOSPITAL_COMMUNITY): Payer: Medicare Other

## 2015-10-15 ENCOUNTER — Inpatient Hospital Stay (HOSPITAL_COMMUNITY)
Admission: RE | Admit: 2015-10-15 | Discharge: 2015-10-17 | DRG: 465 | Disposition: A | Discharge status: Home Health | Payer: Medicare Other | Source: Ambulatory Visit | Attending: Orthopaedic Surgery | Admitting: Orthopaedic Surgery

## 2015-10-15 ENCOUNTER — Encounter (HOSPITAL_COMMUNITY)
Admission: RE | Disposition: A | Discharge status: Home Health | Payer: Self-pay | Source: Ambulatory Visit | Attending: Orthopaedic Surgery

## 2015-10-15 DIAGNOSIS — N189 Chronic kidney disease, unspecified: Secondary | ICD-10-CM | POA: Diagnosis present

## 2015-10-15 DIAGNOSIS — I129 Hypertensive chronic kidney disease with stage 1 through stage 4 chronic kidney disease, or unspecified chronic kidney disease: Secondary | ICD-10-CM | POA: Diagnosis present

## 2015-10-15 DIAGNOSIS — T84068A Wear of articular bearing surface of other internal prosthetic joint, initial encounter: Secondary | ICD-10-CM

## 2015-10-15 DIAGNOSIS — T84033A Mechanical loosening of internal left knee prosthetic joint, initial encounter: Secondary | ICD-10-CM | POA: Diagnosis present

## 2015-10-15 DIAGNOSIS — K219 Gastro-esophageal reflux disease without esophagitis: Secondary | ICD-10-CM | POA: Diagnosis present

## 2015-10-15 DIAGNOSIS — M797 Fibromyalgia: Secondary | ICD-10-CM | POA: Diagnosis present

## 2015-10-15 DIAGNOSIS — M4322 Fusion of spine, cervical region: Secondary | ICD-10-CM | POA: Diagnosis present

## 2015-10-15 DIAGNOSIS — Z96659 Presence of unspecified artificial knee joint: Secondary | ICD-10-CM

## 2015-10-15 DIAGNOSIS — M65862 Other synovitis and tenosynovitis, left lower leg: Secondary | ICD-10-CM | POA: Diagnosis present

## 2015-10-15 DIAGNOSIS — Y793 Surgical instruments, materials and orthopedic devices (including sutures) associated with adverse incidents: Secondary | ICD-10-CM | POA: Diagnosis present

## 2015-10-15 DIAGNOSIS — M25562 Pain in left knee: Secondary | ICD-10-CM | POA: Diagnosis present

## 2015-10-15 DIAGNOSIS — T84093A Other mechanical complication of internal left knee prosthesis, initial encounter: Secondary | ICD-10-CM

## 2015-10-15 HISTORY — PX: TOTAL KNEE REVISION: SHX996

## 2015-10-15 SURGERY — TOTAL KNEE REVISION
Anesthesia: Regional | Site: Knee | Laterality: Left

## 2015-10-15 MED ORDER — HYDROMORPHONE HCL 1 MG/ML IJ SOLN
0.2500 mg | INTRAMUSCULAR | Status: DC | PRN
  Administered 2015-10-15 (×4): 0.5 mg via INTRAVENOUS

## 2015-10-15 MED ORDER — HYDROMORPHONE HCL 1 MG/ML IJ SOLN
0.5000 mg | INTRAMUSCULAR | Status: AC | PRN
  Administered 2015-10-15 (×2): 0.5 mg via INTRAVENOUS

## 2015-10-15 MED ORDER — ACETAMINOPHEN 650 MG RE SUPP: 650.0000 mg | Freq: Four times a day (QID) | RECTAL | Status: DC | PRN

## 2015-10-15 MED ORDER — PHENOL 1.4 % MT LIQD
1.0000 | OROMUCOSAL | Status: DC | PRN
Start: 2015-10-15 — End: 2015-10-17

## 2015-10-15 MED ORDER — ALUM & MAG HYDROXIDE-SIMETH 200-200-20 MG/5ML PO SUSP: 30.0000 mL | ORAL | Status: DC | PRN

## 2015-10-15 MED ORDER — POLYETHYLENE GLYCOL 3350 17 G PO PACK: 17.0000 g | PACK | Freq: Every day | ORAL | Status: DC | PRN

## 2015-10-15 MED ORDER — DEXAMETHASONE SODIUM PHOSPHATE 10 MG/ML IJ SOLN
INTRAMUSCULAR | Status: DC | PRN
  Administered 2015-10-15: 10 mg via INTRAVENOUS

## 2015-10-15 MED ORDER — ASPIRIN 81 MG PO CHEW
81.0000 mg | CHEWABLE_TABLET | Freq: Two times a day (BID) | ORAL | Status: DC
  Administered 2015-10-15 – 2015-10-17 (×4): 81 mg via ORAL
  Filled 2015-10-15 (×4): qty 1

## 2015-10-15 MED ORDER — FENTANYL CITRATE (PF) 100 MCG/2ML IJ SOLN
100.0000 ug | Freq: Once | INTRAMUSCULAR | Status: AC
  Administered 2015-10-15: 100 ug via INTRAVENOUS

## 2015-10-15 MED ORDER — VITAMIN B-12 5000 MCG PO LOZG: 1.0000 | LOZENGE | Freq: Every day | ORAL | Status: DC

## 2015-10-15 MED ORDER — METHOCARBAMOL 500 MG PO TABS
500.0000 mg | ORAL_TABLET | Freq: Four times a day (QID) | ORAL | Status: DC | PRN
  Administered 2015-10-15 – 2015-10-17 (×4): 500 mg via ORAL
  Filled 2015-10-15 (×5): qty 1

## 2015-10-15 MED ORDER — MIDAZOLAM HCL 2 MG/2ML IJ SOLN
INTRAMUSCULAR | Status: AC
  Filled 2015-10-15: qty 2

## 2015-10-15 MED ORDER — HYDROMORPHONE HCL 1 MG/ML IJ SOLN
INTRAMUSCULAR | Status: AC
  Filled 2015-10-15: qty 1

## 2015-10-15 MED ORDER — ATORVASTATIN CALCIUM 20 MG PO TABS
20.0000 mg | ORAL_TABLET | Freq: Every evening | ORAL | Status: DC
  Administered 2015-10-15 – 2015-10-16 (×2): 20 mg via ORAL
  Filled 2015-10-15 (×2): qty 1

## 2015-10-15 MED ORDER — FENTANYL CITRATE (PF) 100 MCG/2ML IJ SOLN
INTRAMUSCULAR | Status: AC
  Administered 2015-10-15: 100 ug via INTRAVENOUS
  Filled 2015-10-15: qty 2

## 2015-10-15 MED ORDER — CEFAZOLIN IN D5W 1 GM/50ML IV SOLN
1.0000 g | Freq: Four times a day (QID) | INTRAVENOUS | Status: AC
  Administered 2015-10-15 – 2015-10-16 (×2): 1 g via INTRAVENOUS
  Filled 2015-10-15 (×2): qty 50

## 2015-10-15 MED ORDER — MENTHOL 3 MG MT LOZG
1.0000 | LOZENGE | OROMUCOSAL | Status: DC | PRN
Start: 2015-10-15 — End: 2015-10-17

## 2015-10-15 MED ORDER — LACTATED RINGERS IV SOLN
INTRAVENOUS | Status: DC | PRN
  Administered 2015-10-15 (×2): via INTRAVENOUS

## 2015-10-15 MED ORDER — FENTANYL CITRATE (PF) 100 MCG/2ML IJ SOLN
INTRAMUSCULAR | Status: AC
  Filled 2015-10-15: qty 2

## 2015-10-15 MED ORDER — ONDANSETRON HCL 4 MG/2ML IJ SOLN
INTRAMUSCULAR | Status: AC
  Filled 2015-10-15: qty 2

## 2015-10-15 MED ORDER — METOCLOPRAMIDE HCL 5 MG/ML IJ SOLN: 5.0000 mg | Freq: Three times a day (TID) | INTRAMUSCULAR | Status: DC | PRN

## 2015-10-15 MED ORDER — SODIUM CHLORIDE 0.9 % IV SOLN
INTRAVENOUS | Status: DC
  Administered 2015-10-15: 22:00:00 via INTRAVENOUS

## 2015-10-15 MED ORDER — ADULT MULTIVITAMIN W/MINERALS CH
1.0000 | ORAL_TABLET | Freq: Every day | ORAL | Status: DC
  Administered 2015-10-15 – 2015-10-17 (×3): 1 via ORAL
  Filled 2015-10-15 (×2): qty 1

## 2015-10-15 MED ORDER — HYDROMORPHONE HCL 1 MG/ML IJ SOLN
1.0000 mg | INTRAMUSCULAR | Status: DC | PRN
  Administered 2015-10-17 (×2): 1 mg via INTRAVENOUS
  Filled 2015-10-15 (×2): qty 1

## 2015-10-15 MED ORDER — SODIUM CHLORIDE 0.9 % IR SOLN
Status: DC | PRN
  Administered 2015-10-15: 3000 mL

## 2015-10-15 MED ORDER — FENTANYL CITRATE (PF) 100 MCG/2ML IJ SOLN
INTRAMUSCULAR | Status: DC | PRN
  Administered 2015-10-15 (×3): 50 ug via INTRAVENOUS

## 2015-10-15 MED ORDER — PROPOFOL 10 MG/ML IV BOLUS
INTRAVENOUS | Status: DC | PRN
  Administered 2015-10-15: 150 mg via INTRAVENOUS
  Administered 2015-10-15: 50 mg via INTRAVENOUS

## 2015-10-15 MED ORDER — ACETAMINOPHEN 325 MG PO TABS
650.0000 mg | ORAL_TABLET | Freq: Four times a day (QID) | ORAL | Status: DC | PRN
  Administered 2015-10-17: 650 mg via ORAL
  Filled 2015-10-15: qty 2

## 2015-10-15 MED ORDER — ONDANSETRON HCL 4 MG PO TABS: 4.0000 mg | ORAL_TABLET | Freq: Four times a day (QID) | ORAL | Status: DC | PRN

## 2015-10-15 MED ORDER — SODIUM CHLORIDE 0.9 % IV SOLN
INTRAVENOUS | Status: DC
  Administered 2015-10-15: 11:00:00 via INTRAVENOUS

## 2015-10-15 MED ORDER — DEXAMETHASONE SODIUM PHOSPHATE 10 MG/ML IJ SOLN
INTRAMUSCULAR | Status: AC
  Filled 2015-10-15: qty 1

## 2015-10-15 MED ORDER — MONTELUKAST SODIUM 10 MG PO TABS
10.0000 mg | ORAL_TABLET | Freq: Every evening | ORAL | Status: DC
  Administered 2015-10-15 – 2015-10-16 (×2): 10 mg via ORAL
  Filled 2015-10-15 (×2): qty 1

## 2015-10-15 MED ORDER — METHOCARBAMOL 1000 MG/10ML IJ SOLN
500.0000 mg | Freq: Four times a day (QID) | INTRAVENOUS | Status: DC | PRN
  Filled 2015-10-15: qty 5

## 2015-10-15 MED ORDER — FENTANYL CITRATE (PF) 100 MCG/2ML IJ SOLN
INTRAMUSCULAR | Status: AC
  Filled 2015-10-15: qty 4

## 2015-10-15 MED ORDER — CHLORHEXIDINE GLUCONATE 4 % EX LIQD: 60.0000 mL | Freq: Once | CUTANEOUS | Status: DC

## 2015-10-15 MED ORDER — LOSARTAN POTASSIUM 50 MG PO TABS
100.0000 mg | ORAL_TABLET | Freq: Every evening | ORAL | Status: DC
  Administered 2015-10-15 – 2015-10-16 (×2): 100 mg via ORAL
  Filled 2015-10-15 (×2): qty 2

## 2015-10-15 MED ORDER — KETOROLAC TROMETHAMINE 15 MG/ML IJ SOLN
7.5000 mg | Freq: Four times a day (QID) | INTRAMUSCULAR | Status: AC
  Administered 2015-10-15 – 2015-10-16 (×4): 7.5 mg via INTRAVENOUS
  Filled 2015-10-15 (×4): qty 1

## 2015-10-15 MED ORDER — FLEET ENEMA 7-19 GM/118ML RE ENEM: 1.0000 | ENEMA | Freq: Once | RECTAL | Status: DC | PRN

## 2015-10-15 MED ORDER — FAMOTIDINE 20 MG PO TABS
20.0000 mg | ORAL_TABLET | Freq: Every day | ORAL | Status: DC
  Administered 2015-10-16 – 2015-10-17 (×2): 20 mg via ORAL
  Filled 2015-10-15 (×2): qty 1

## 2015-10-15 MED ORDER — ONDANSETRON HCL 4 MG/2ML IJ SOLN: 4.0000 mg | Freq: Four times a day (QID) | INTRAMUSCULAR | Status: DC | PRN

## 2015-10-15 MED ORDER — LIDOCAINE HCL (CARDIAC) 20 MG/ML IV SOLN
INTRAVENOUS | Status: DC | PRN
  Administered 2015-10-15: 100 mg via INTRAVENOUS

## 2015-10-15 MED ORDER — SORBITOL 70 % SOLN: 30.0000 mL | Freq: Every day | Status: DC | PRN

## 2015-10-15 MED ORDER — PANTOPRAZOLE SODIUM 40 MG PO TBEC
40.0000 mg | DELAYED_RELEASE_TABLET | Freq: Every day | ORAL | Status: DC
  Administered 2015-10-16 – 2015-10-17 (×2): 40 mg via ORAL
  Filled 2015-10-15 (×2): qty 1

## 2015-10-15 MED ORDER — SENNOSIDES-DOCUSATE SODIUM 8.6-50 MG PO TABS
2.0000 | ORAL_TABLET | Freq: Two times a day (BID) | ORAL | Status: DC
  Administered 2015-10-15 – 2015-10-17 (×4): 2 via ORAL
  Filled 2015-10-15 (×5): qty 2

## 2015-10-15 MED ORDER — OXYCODONE HCL 5 MG PO TABS
5.0000 mg | ORAL_TABLET | ORAL | Status: DC | PRN
  Administered 2015-10-15 – 2015-10-17 (×12): 10 mg via ORAL
  Filled 2015-10-15 (×14): qty 2

## 2015-10-15 MED ORDER — METOCLOPRAMIDE HCL 5 MG PO TABS: 5.0000 mg | ORAL_TABLET | Freq: Three times a day (TID) | ORAL | Status: DC | PRN

## 2015-10-15 SURGICAL SUPPLY — 68 items
ANCHOR CORKSCREW FIBER 5.5X15 (Anchor) ×2 IMPLANT
BANDAGE ACE 6X5 VEL STRL LF (GAUZE/BANDAGES/DRESSINGS) ×2 IMPLANT
BANDAGE ESMARK 6X9 LF (GAUZE/BANDAGES/DRESSINGS) ×1 IMPLANT
BLADE SAG 18X100X1.27 (BLADE) IMPLANT
BLADE SAGITTAL 25.0X1.27X90 (BLADE) IMPLANT
BLADE SURG ROTATE 9660 (MISCELLANEOUS) IMPLANT
BNDG COHESIVE 6X5 TAN STRL LF (GAUZE/BANDAGES/DRESSINGS) ×4 IMPLANT
BNDG ESMARK 6X9 LF (GAUZE/BANDAGES/DRESSINGS) ×2
BOWL SMART MIX CTS (DISPOSABLE) IMPLANT
COVER SURGICAL LIGHT HANDLE (MISCELLANEOUS) ×2 IMPLANT
CUFF TOURNIQUET SINGLE 34IN LL (TOURNIQUET CUFF) ×2 IMPLANT
CUFF TOURNIQUET SINGLE 44IN (TOURNIQUET CUFF) IMPLANT
DRAPE IMP U-DRAPE 54X76 (DRAPES) ×2 IMPLANT
DRAPE ORTHO SPLIT 77X108 STRL (DRAPES) ×2
DRAPE SURG ORHT 6 SPLT 77X108 (DRAPES) ×2 IMPLANT
DRAPE U-SHAPE 47X51 STRL (DRAPES) ×2 IMPLANT
DRSG PAD ABDOMINAL 8X10 ST (GAUZE/BANDAGES/DRESSINGS) ×2 IMPLANT
DURAPREP 26ML APPLICATOR (WOUND CARE) ×2 IMPLANT
ELECT REM PT RETURN 9FT ADLT (ELECTROSURGICAL) ×2
ELECTRODE REM PT RTRN 9FT ADLT (ELECTROSURGICAL) ×1 IMPLANT
EVACUATOR 1/8 PVC DRAIN (DRAIN) IMPLANT
FACESHIELD STD STERILE (MASK) ×2 IMPLANT
GAUZE SPONGE 4X4 12PLY STRL (GAUZE/BANDAGES/DRESSINGS) ×2 IMPLANT
GAUZE XEROFORM 1X8 LF (GAUZE/BANDAGES/DRESSINGS) ×2 IMPLANT
GLOVE BIO SURGEON STRL SZ8 (GLOVE) IMPLANT
GLOVE BIOGEL PI IND STRL 8 (GLOVE) ×1 IMPLANT
GLOVE BIOGEL PI INDICATOR 8 (GLOVE) ×1
GLOVE ORTHO TXT STRL SZ7.5 (GLOVE) ×2 IMPLANT
GOWN STRL REUS W/ TWL LRG LVL3 (GOWN DISPOSABLE) IMPLANT
GOWN STRL REUS W/ TWL XL LVL3 (GOWN DISPOSABLE) ×3 IMPLANT
GOWN STRL REUS W/TWL LRG LVL3 (GOWN DISPOSABLE)
GOWN STRL REUS W/TWL XL LVL3 (GOWN DISPOSABLE) ×3
HANDPIECE INTERPULSE COAX TIP (DISPOSABLE) ×1
IMMOBILIZER KNEE 22 (SOFTGOODS) ×2 IMPLANT
IMMOBILIZER KNEE 22 UNIV (SOFTGOODS) ×2 IMPLANT
KIT BASIN OR (CUSTOM PROCEDURE TRAY) ×2 IMPLANT
KIT ROOM TURNOVER OR (KITS) ×2 IMPLANT
MANIFOLD NEPTUNE II (INSTRUMENTS) ×2 IMPLANT
NDL SUT 6 .5 CRC .975X.05 MAYO (NEEDLE) ×1 IMPLANT
NEEDLE MAYO TAPER (NEEDLE) ×1
NS IRRIG 1000ML POUR BTL (IV SOLUTION) ×2 IMPLANT
PACK TOTAL JOINT (CUSTOM PROCEDURE TRAY) ×2 IMPLANT
PACK UNIVERSAL I (CUSTOM PROCEDURE TRAY) ×2 IMPLANT
PAD ARMBOARD 7.5X6 YLW CONV (MISCELLANEOUS) ×2 IMPLANT
PADDING CAST ABS 6INX4YD NS (CAST SUPPLIES) ×1
PADDING CAST ABS COTTON 6X4 NS (CAST SUPPLIES) ×1 IMPLANT
PADDING CAST COTTON 6X4 STRL (CAST SUPPLIES) ×2 IMPLANT
PLATE ROT INSERT 12.5MM (Plate) ×2 IMPLANT
RASP HELIOCORDIAL MED (MISCELLANEOUS) IMPLANT
SET HNDPC FAN SPRY TIP SCT (DISPOSABLE) ×1 IMPLANT
SET PAD KNEE POSITIONER (MISCELLANEOUS) ×2 IMPLANT
SPONGE GAUZE 4X4 12PLY STER LF (GAUZE/BANDAGES/DRESSINGS) ×2 IMPLANT
STAPLER VISISTAT 35W (STAPLE) ×2 IMPLANT
SUCTION FRAZIER HANDLE 10FR (MISCELLANEOUS) ×1
SUCTION TUBE FRAZIER 10FR DISP (MISCELLANEOUS) ×1 IMPLANT
SUT VIC AB 0 CT1 27 (SUTURE) ×2
SUT VIC AB 0 CT1 27XBRD ANBCTR (SUTURE) ×2 IMPLANT
SUT VIC AB 1 CT1 27 (SUTURE) ×2
SUT VIC AB 1 CT1 27XBRD ANBCTR (SUTURE) ×2 IMPLANT
SUT VIC AB 2-0 CT1 27 (SUTURE) ×2
SUT VIC AB 2-0 CT1 TAPERPNT 27 (SUTURE) ×2 IMPLANT
SWAB COLLECTION DEVICE MRSA (MISCELLANEOUS) IMPLANT
TOWEL OR 17X24 6PK STRL BLUE (TOWEL DISPOSABLE) ×2 IMPLANT
TOWEL OR 17X26 10 PK STRL BLUE (TOWEL DISPOSABLE) ×2 IMPLANT
TRAY FOLEY CATH 16FRSI W/METER (SET/KITS/TRAYS/PACK) IMPLANT
TUBE ANAEROBIC SPECIMEN COL (MISCELLANEOUS) IMPLANT
WATER STERILE IRR 1000ML POUR (IV SOLUTION) IMPLANT
WRAP KNEE MAXI GEL POST OP (GAUZE/BANDAGES/DRESSINGS) ×2 IMPLANT

## 2015-10-15 NOTE — Transfer of Care (Signed)
Immediate Anesthesia Transfer of Care Note  Patient: Oscar CharonBennie F Heal Jr.  Procedure(s) Performed: Procedure(s): LEFT TOTAL KNEE REVISION, synovectomy with poly exchange (Left)  Patient Location: PACU  Anesthesia Type:GA combined with regional for post-op pain  Level of Consciousness: awake, alert  and oriented  Airway & Oxygen Therapy: Patient Spontanous Breathing and Patient connected to nasal cannula oxygen  Post-op Assessment: Report given to RN, Post -op Vital signs reviewed and stable and Patient moving all extremities X 4  Post vital signs: Reviewed and stable  Last Vitals:  Vitals:   10/15/15 1059  BP: 139/76  Pulse: (!) 51  Resp: 20  Temp: 36.4 C    Last Pain:  Vitals:   10/15/15 1630  TempSrc:   PainSc: 10-Worst pain ever         Complications: No apparent anesthesia complications

## 2015-10-15 NOTE — Progress Notes (Signed)
Orthopedic Tech Progress Note Patient Details:  Oscar CharonBennie F Oldaker Jr. 12/31/1945 [id number]  CPM Left Knee CPM Left Knee: On Left Knee Flexion (Degrees): 90 Left Knee Extension (Degrees): 0   Oscar Ward 10/15/2015, 4:57 PM

## 2015-10-15 NOTE — Anesthesia Postprocedure Evaluation (Signed)
Anesthesia Post Note  Patient: Oscar CharonBennie F Cormier Jr.  Procedure(s) Performed: Procedure(s) (LRB): LEFT TOTAL KNEE REVISION, synovectomy with poly exchange (Left)  Patient location during evaluation: PACU Anesthesia Type: General Level of consciousness: awake and alert Pain management: pain level controlled Vital Signs Assessment: post-procedure vital signs reviewed and stable Respiratory status: spontaneous breathing, nonlabored ventilation, respiratory function stable and patient connected to nasal cannula oxygen Cardiovascular status: blood pressure returned to baseline and stable Postop Assessment: no signs of nausea or vomiting Anesthetic complications: no    Last Vitals:  Vitals:   10/15/15 1635 10/15/15 1650  BP: (!) 123/98 (!) 144/83  Pulse: 89 84  Resp: 15 18  Temp:      Last Pain:  Vitals:   10/15/15 1700  TempSrc:   PainSc: 7                  Oscar Ward Astrid DivineS Esai Stecklein

## 2015-10-15 NOTE — Anesthesia Preprocedure Evaluation (Addendum)
Anesthesia Evaluation  Patient identified by MRN, date of birth, ID band Patient awake    Reviewed: Allergy & Precautions, H&P , NPO status , Patient's Chart, lab work & pertinent test results  History of Anesthesia Complications (+) PONV  Airway Mallampati: III  TM Distance: >3 FB Neck ROM: Full    Dental no notable dental hx. (+) Teeth Intact, Dental Advisory Given   Pulmonary neg pulmonary ROS,    Pulmonary exam normal breath sounds clear to auscultation       Cardiovascular hypertension,  Rhythm:Regular Rate:Normal     Neuro/Psych  Headaches, negative psych ROS   GI/Hepatic Neg liver ROS, GERD  Medicated and Controlled,  Endo/Other  negative endocrine ROS  Renal/GU Renal InsufficiencyRenal disease  negative genitourinary   Musculoskeletal  (+) Arthritis , Osteoarthritis,    Abdominal   Peds  Hematology negative hematology ROS (+)   Anesthesia Other Findings   Reproductive/Obstetrics negative OB ROS                            Anesthesia Physical Anesthesia Plan  ASA: II  Anesthesia Plan: General and Regional   Post-op Pain Management: GA combined w/ Regional for post-op pain   Induction: Intravenous  Airway Management Planned: LMA  Additional Equipment:   Intra-op Plan:   Post-operative Plan: Extubation in OR  Informed Consent: I have reviewed the patients History and Physical, chart, labs and discussed the procedure including the risks, benefits and alternatives for the proposed anesthesia with the patient or authorized representative who has indicated his/her understanding and acceptance.   Dental advisory given  Plan Discussed with: CRNA  Anesthesia Plan Comments:        Anesthesia Quick Evaluation

## 2015-10-15 NOTE — Brief Op Note (Signed)
10/15/2015  4:08 PM  PATIENT:  Oscar CharonBennie F Lebow Jr.  70 y.o. male  PRE-OPERATIVE DIAGNOSIS:  Failed, loose left total knee  POST-OPERATIVE DIAGNOSIS:  Failed, loose left total knee  PROCEDURE:  Procedure(s): LEFT TOTAL KNEE REVISION, synovectomy with poly exchange (Left)  SURGEON:  Surgeon(s) and Role:    * Kathryne Hitchhristopher Y Tamber Burtch, MD - Primary  ASSISTANTS: April Greene, RNFA   ANESTHESIA:   regional and general  EBL:  Total I/O In: 1000 [I.V.:1000] Out: -   COUNTS:  YES  TOURNIQUET:  * Missing tourniquet times found for documented tourniquets in log:  [id number] *  DICTATION: .Other Dictation: Dictation Number [id number]  PLAN OF CARE: Admit to inpatient   PATIENT DISPOSITION:  PACU - hemodynamically stable.   Delay start of Pharmacological VTE agent (>24hrs) due to surgical blood loss or risk of bleeding: no

## 2015-10-15 NOTE — Anesthesia Procedure Notes (Addendum)
Anesthesia Regional Block:  Adductor canal block  Pre-Anesthetic Checklist: ,, timeout performed, Correct Patient, Correct Site, Correct Laterality, Correct Procedure, Correct Position, site marked, Risks and benefits discussed, pre-op evaluation,  At surgeon's request and post-op pain management  Laterality: Left  Prep: Maximum Sterile Barrier Precautions used, chloraprep       Needles:  Injection technique: Single-shot  Needle Type: Echogenic Stimulator Needle     Needle Length: 9cm 9 cm Needle Gauge: 21 and 21 G    Additional Needles:  Procedures: ultrasound guided (picture in chart) Adductor canal block Narrative:  Start time: 10/15/2015 12:40 PM End time: 10/15/2015 12:50 PM Injection made incrementally with aspirations every 5 mL. Anesthesiologist: Gaynelle AduFITZGERALD, Anelle Parlow  Additional Notes: 2% Lidocaine skin wheel.

## 2015-10-15 NOTE — Progress Notes (Signed)
Patient complains of uncontrolled pain after receiving full 2mg  of Dilauded.  Dr. Armanda MagicGuidetti was notified, new orders received.

## 2015-10-15 NOTE — Anesthesia Procedure Notes (Signed)
Procedure Name: LMA Insertion Date/Time: 10/15/2015 2:23 PM Performed by: Rogelia BogaMUELLER, Phillip Sandler P Pre-anesthesia Checklist: Patient identified, Emergency Drugs available, Suction available, Patient being monitored and Timeout performed Patient Re-evaluated:Patient Re-evaluated prior to inductionOxygen Delivery Method: Circle system utilized Preoxygenation: Pre-oxygenation with 100% oxygen Intubation Type: IV induction LMA: LMA inserted LMA Size: 5.0 Number of attempts: 1 Placement Confirmation: positive ETCO2 and breath sounds checked- equal and bilateral Tube secured with: Tape Dental Injury: Teeth and Oropharynx as per pre-operative assessment

## 2015-10-15 NOTE — H&P (Signed)
TOTAL KNEE REVISION ADMISSION H&P  Patient is being admitted for left revision total knee arthroplasty.  Subjective:  Chief Complaint:left knee pain.  HPI: Oscar CharonBennie F Naeem Jr., 70 y.o. male, has a history of pain and functional disability in the left knee(s) due to failed previous arthroplasty and patient has failed non-surgical conservative treatments for greater than 12 weeks to include NSAID's and/or analgesics, flexibility and strengthening excercises and activity modification. The indications for the revision of the total knee arthroplasty are loosening of one or more components and bearing surface wear leading to symptomatic synovitis. Onset of symptoms was gradual starting 2 years ago with gradually worsening course since that time.  Prior procedures on the left knee(s) include arthroplasty.  Patient currently rates pain in the left knee(s) at 6 out of 10 with activity. There is worsening of pain with activity and weight bearing, pain with passive range of motion and joint swelling.  Patient has evidence of prosthetic loosening by imaging studies. This condition presents safety issues increasing the risk of falls.  There is no current active infection.  Patient Active Problem List   Diagnosis Date Noted  . Failed total left knee replacement (HCC) 10/15/2015  . Allergic rhinoconjunctivitis 01/10/2015  . LPRD (laryngopharyngeal reflux disease) 01/10/2015  . Other headache syndrome 01/10/2015   Past Medical History:  Diagnosis Date  . Arthritis   . Chronic kidney disease    only one kidney since bitth  . Fibromyalgia   . GERD (gastroesophageal reflux disease)   . Headache   . Hypertension   . PONV (postoperative nausea and vomiting)     Past Surgical History:  Procedure Laterality Date  . ADENOIDECTOMY    . APPENDECTOMY    . CERVICAL FUSION    . CHOLECYSTECTOMY    . COLON SURGERY     megacolon decreased   . HEMORRHOID SURGERY  1985  . HERNIA REPAIR     times 2  . JOINT  REPLACEMENT Left    knee  . MENISCUS REPAIR Right 2011  . PROSTATE SURGERY  2006  . right testicle      removed  . SHOULDER SURGERY Left    Bone Spur  . SINOSCOPY    . TONSILLECTOMY      Prescriptions Prior to Admission  Medication Sig Dispense Refill Last Dose  . atorvastatin (LIPITOR) 20 MG tablet Take 20 mg by mouth every evening.   8 10/14/2015 at Unknown time  . azelastine (ASTELIN) 0.1 % nasal spray USE ONE-TWO SPRAYS IN EACH NOSTRIL ONCE OR TWICE DAILY AS NEEDED (Patient taking differently: Place 2 sprays into both nostrils daily. ) 30 mL 5 10/15/2015 at 0800  . butalbital-acetaminophen-caffeine (FIORICET, ESGIC) 50-325-40 MG tablet Take 1 tablet by mouth as needed for headache.   3 10/15/2015 at 0800  . cetirizine (ZYRTEC) 10 MG tablet Take 10 mg by mouth every evening.    10/14/2015 at Unknown time  . Cyanocobalamin (VITAMIN B-12) 5000 MCG LOZG Take 1 lozenge by mouth daily.   Past Week at Unknown time  . HYDROcodone-acetaminophen (NORCO) 10-325 MG tablet Take 2 tablets by mouth 2 (two) times daily.   0 10/15/2015 at 0700  . losartan (COZAAR) 100 MG tablet Take 100 mg by mouth every evening.   10/14/2015 at Unknown time  . montelukast (SINGULAIR) 10 MG tablet Take 10 mg by mouth every evening.   3 10/14/2015 at Unknown time  . Multiple Vitamin (MULTIVITAMIN WITH MINERALS) TABS tablet Take 1 tablet by mouth daily.  Past Week at Unknown time  . pantoprazole (PROTONIX) 40 MG tablet Take 40 mg by mouth daily.   10/15/2015 at 0800  . pantoprazole (PROTONIX) 40 MG tablet TAKE 1 TABLET BY MOUTH EVERY DAY FOR REFLUX 30 tablet 3 10/15/2015 at 0800  . ranitidine (ZANTAC) 300 MG tablet TAKE 1 TABLET BY MOUTH EVERY EVENING 30 tablet 5 10/14/2015 at Unknown time  . senna-docusate (SENOKOT-S) 8.6-50 MG tablet Take 2 tablets by mouth 2 (two) times daily.    10/14/2015 at Unknown time   Allergies  Allergen Reactions  . Marcaine [Bupivacaine Hcl] Rash    Social History  Substance Use Topics  . Smoking  status: Never Smoker  . Smokeless tobacco: Never Used  . Alcohol use No     Comment: wine rarely    Family History  Problem Relation Age of Onset  . Allergic rhinitis Neg Hx   . Angioedema Neg Hx   . Asthma Neg Hx   . Atopy Neg Hx   . Eczema Neg Hx   . Immunodeficiency Neg Hx   . Urticaria Neg Hx       Review of Systems  Musculoskeletal: Positive for joint pain.  All other systems reviewed and are negative.    Objective:  Physical Exam  Constitutional: He is oriented to person, place, and time. He appears well-developed and well-nourished.  HENT:  Head: Normocephalic and atraumatic.  Eyes: EOM are normal. Pupils are equal, round, and reactive to light.  Neck: Normal range of motion. Neck supple.  Cardiovascular: Normal rate and regular rhythm.   Respiratory: Effort normal and breath sounds normal.  GI: Soft. Bowel sounds are normal.  Musculoskeletal:       Left knee: He exhibits swelling and effusion.  Neurological: He is alert and oriented to person, place, and time.  Skin: Skin is warm and dry.  Psychiatric: He has a normal mood and affect.    Vital signs in last 24 hours: Temp:  [97.6 F (36.4 C)] 97.6 F (36.4 C) (09/19 1059) Pulse Rate:  [51] 51 (09/19 1059) Resp:  [20] 20 (09/19 1059) BP: (139)/(76) 139/76 (09/19 1059) SpO2:  [100 %] 100 % (09/19 1059) Weight:  [90.3 kg (199 lb)] 90.3 kg (199 lb) (09/19 1059)  Labs:  Estimated body mass index is 26.99 kg/m as calculated from the following:   Height as of 10/08/15: 6' (1.829 m).   Weight as of this encounter: 90.3 kg (199 lb). Aspiration from the left knee showed no organisms and a WBC less than 200.   Imaging Review Bone scan shows evidence of synovitis around the left knee and possible early prosthetic loosening.  Assessment/Plan:  Recurrent synovitis left total knee with questionable prosthetic loosening  To the OR today for a left knee arthrotomy, synovectomy, poly-liner exchange and possible  revision of one or both components.  Risks and benefits have been discussed in detail.

## 2015-10-16 ENCOUNTER — Encounter (HOSPITAL_COMMUNITY): Payer: Self-pay

## 2015-10-16 MED ORDER — LORATADINE 10 MG PO TABS
10.0000 mg | ORAL_TABLET | Freq: Every day | ORAL | Status: DC
  Administered 2015-10-17: 10 mg via ORAL
  Filled 2015-10-16: qty 1

## 2015-10-16 NOTE — Progress Notes (Signed)
Ordered foot pumps, did not arrive. Called portable equipment x2.

## 2015-10-16 NOTE — Progress Notes (Signed)
Orthopedic Tech Progress Note Patient Details:  Oscar CharonBennie F Rebuck Jr. 11/23/1945 [id number]  Patient ID: Oscar CharonBennie F Viscuso Jr., male   DOB: 02/04/1945, 70 y.o.   MRN: [id number] Applied cpm 0-60  Trinna PostMartinez, [name] 10/16/2015, 5:50 AM

## 2015-10-16 NOTE — Progress Notes (Signed)
OT Cancellation Note  Patient Details Name: Oscar Ward. MRN: [id number] DOB: 1945-11-03   Cancelled Treatment:    Reason Eval/Treat Not Completed: OT screened, no needs identified, will sign off. Spoke to pt and wife. All equipment needs are met and they recall compensatory strategies for ADL. Wife is able to assist pt with ADL as needed. Declined OT eval.  Malka So 10/16/2015, 1:52 PM  [phone]

## 2015-10-16 NOTE — Progress Notes (Signed)
Subjective: 1 Day Post-Op Procedure(s) (LRB): LEFT TOTAL KNEE REVISION, synovectomy with poly exchange (Left) Patient reports pain as mild.    Objective: Vital signs in last 24 hours: Temp:  [97.6 F (36.4 C)-98.8 F (37.1 C)] 97.9 F (36.6 C) (09/20 0531) Pulse Rate:  [51-89] 66 (09/20 0531) Resp:  [10-20] 16 (09/20 0531) BP: (112-156)/(55-98) 119/91 (09/20 0531) SpO2:  [95 %-100 %] 98 % (09/20 0531) Weight:  [90.3 kg (199 lb)] 90.3 kg (199 lb) (09/19 1059)  Intake/Output from previous day: 09/19 0701 - 09/20 0700 In: 2408.8 [P.O.:240; I.V.:2068.8; IV Piggyback:100] Out: 750 [Urine:600; Blood:150] Intake/Output this shift: No intake/output data recorded.  No results for input(s): HGB in the last 72 hours. No results for input(s): WBC, RBC, HCT, PLT in the last 72 hours. No results for input(s): NA, K, CL, CO2, BUN, CREATININE, GLUCOSE, CALCIUM in the last 72 hours. No results for input(s): LABPT, INR in the last 72 hours.  Sensation intact distally Intact pulses distally Dorsiflexion/Plantar flexion intact Incision: no drainage No cellulitis present Compartment soft  Assessment/Plan: 1 Day Post-Op Procedure(s) (LRB): LEFT TOTAL KNEE REVISION, synovectomy with poly exchange (Left) Up with therapy Plan for discharge tomorrow Discharge home with home health  Oscar Ward 10/16/2015, 7:42 AM

## 2015-10-16 NOTE — Op Note (Signed)
Oscar Ward, Oscar Ward                 ACCOUNT NO.:  [id number]  MEDICAL RECORD NO.:  [id number]  LOCATION:  5N04C                        FACILITY:  [facility]  PHYSICIAN:  Vanita Panda. 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  PROCEDURE:  Partial synovectomy left total knee without sizing of the polyethylene liner.  COMPONENTS:  Exchange of a 10-mm thickness polyethylene liner to a 12.5 mm thickness.  FINDINGS:  No evidence of infection or loosening of the prosthetic components, minimal synovitis.  SURGEON:  Vanita Panda. Magnus Ivan, M.D.  ASSISTANT:  Oscar Ward, RNFA.  ANESTHESIA: 1. Left lower extremity adductor canal block. 2. General.  TOURNIQUET TIME:  Less than 2 hours.  COMPLICATIONS:  None.  BLOOD LOSS:  Less than 100 mL.  INDICATIONS:  Oscar Ward is a 70 year old gentleman, well known to me.  He is a very active individual.  In 2011, he underwent a successful left total knee arthroplasty.  Last year, he felt the knee was slightly ligamentously lax and has done well but within this last year started to develop recurrent effusion of his knee.  We obtained a three-phase bone scan and it showed questionable early prosthetic loosening plus having synovitis.  We were able to aspirate the knee and found no evidence of infection and minimal white blood cell count.  Due to positive drawer sign of his knee, I felt that he would benefit from up sizing his poly as well as synovectomy and assessing the components to see if there was any gross evidence of motion that we would likely revise the entire knee.  The risks and benefits of the surgery were explained to him in detail and he did wish to  proceed.  PROCEDURE DESCRIPTION:  After informed consent was obtained, appropriate left knee was marked, anesthesia was obtained with an adductor canal block.  He was brought to the operating room, placed supine on the operating table.  General anesthesia was then obtained.  A nonsterile tourniquet was placed around his upper left leg and his left leg was prepped and draped from the thigh down the ankle with DuraPrep and sterile drapes and a sterile stockinette.  Time-out was called to identify correct patient, correct left knee.  We then used an Esmarch to wrap out the leg and tourniquet was inflated to 300 mm of pressure.  I then used his old incision and made a direct midline incision down the knee and carried out down to the knee joint.  I carried out a medial parapatellar arthrotomy and found minimal joint effusion, some synovitis but otherwise normal appearing knee.  We did remove his previous polyethylene and found no significant wear.  The knee did feel ligamentously lax.  So I then performed a partial synovectomy of the knee and irrigated out the knee completely, removed minimal debris from the knee.  We assessed the tibial component and the femoral component and did not find them to be grossly loose.  I then trialed a 12.5-mm polyethylene insert.  When we felt good with stability on this, we then placed the real 12.5-mm polyethylene insert.  We then irrigated the knee again with normal saline solution and closed the joint capsule with interrupted #1 Ethibond suture.  I felt that the patellar tendon did pull up some, it felt stable but I still felt putting a 5.5 suture anchor with peak would help supplement any possible patellar tendon issues.  I then closed the arthrotomy with interrupted #1 Vicryl suture followed by 0 Vicryl deep tissue, 2-0 Vicryl subcutaneous tissue, 4-0 Monocryl subcuticular stitch and Steri-Strips on the skin.  A well- padded sterile dressing was applied.   The tourniquet was let down.  His toes pinked nicely.  He was awakened and extubated, taken to recovery room in stable condition.  Of note, Oscar Ward, RNFA assisted throughout the case.     Vanita Pandahristopher Y. Magnus IvanBlackman, M.D.     CYB/MEDQ  D:  10/15/2015  T:  10/16/2015  Job:  [id number]

## 2015-10-16 NOTE — Progress Notes (Signed)
Physical Therapy Treatment Patient Details Name: Oscar Ward. MRN: [id number] DOB: 10-15-1945 Today's Date: 10/16/2015    History of Present Illness Pt is a 70 y/o male s/p L TKR revision surgery. PMH including but not limited to CKD, HTN and L TKR approximately 6 years ago.    PT Comments    Pt presented supine in bed with HOB elevated, L LE in CPM, awake and willing to participate in therapy session. Pt making good progress towards achieving his functional goals. PT plan to perform stair training at next session. Pt would continue to benefit from skilled physical therapy services at this time while admitted and after d/c to address his limitations in order to improve his overall safety and independence with functional mobility.   Follow Up Recommendations  Home health PT;Supervision for mobility/OOB     Equipment Recommendations  None recommended by PT    Recommendations for Other Services       Precautions / Restrictions Precautions Precautions: Fall;Knee Restrictions Weight Bearing Restrictions: Yes LLE Weight Bearing: Weight bearing as tolerated    Mobility  Bed Mobility Overal bed mobility: Needs Assistance Bed Mobility: Supine to Sit;Sit to Supine     Supine to sit: Supervision;HOB elevated Sit to supine: Supervision   General bed mobility comments: pt required increased time  Transfers Overall transfer level: Needs assistance Equipment used: Rolling walker (2 wheeled) Transfers: Sit to/from Stand Sit to Stand: Supervision         General transfer comment: pt required increased time to complete  Ambulation/Gait Ambulation/Gait assistance: Min guard Ambulation Distance (Feet): 150 Feet Assistive device: Rolling walker (2 wheeled) Gait Pattern/deviations: Step-through pattern;Decreased step length - right;Decreased stance time - left;Decreased weight shift to left Gait velocity: decreased Gait velocity interpretation: Below normal speed for  age/gender General Gait Details: pt required VC'ing for foreward gaze   Stairs            Wheelchair Mobility    Modified Rankin (Stroke Patients Only)       Balance Overall balance assessment: Needs assistance Sitting-balance support: Feet supported;No upper extremity supported Sitting balance-Leahy Scale: Good     Standing balance support: During functional activity;No upper extremity supported Standing balance-Leahy Scale: Fair                      Cognition Arousal/Alertness: Awake/alert Behavior During Therapy: WFL for tasks assessed/performed Overall Cognitive Status: Within Functional Limits for tasks assessed                      Exercises Total Joint Exercises Goniometric ROM: Flexion = 80 degrees; Extension = lacking 10 degrees to neutral; measured in sitting    General Comments        Pertinent Vitals/Pain Pain Assessment: 0-10 Pain Score: 4  Pain Location: L knee Pain Descriptors / Indicators: Guarding;Burning;Sore Pain Intervention(s): Monitored during session;Repositioned    Home Living                      Prior Function            PT Goals (current goals can now be found in the care plan section) Acute Rehab PT Goals Patient Stated Goal: return home PT Goal Formulation: With patient/family Time For Goal Achievement: 10/23/15 Potential to Achieve Goals: Good Progress towards PT goals: Progressing toward goals    Frequency    7X/week      PT Plan Current plan remains appropriate  Co-evaluation             End of Session Equipment Utilized During Treatment: Gait belt Activity Tolerance: Patient limited by pain;Patient limited by fatigue Patient left: in bed;in CPM;with call bell/phone within reach;with family/visitor present     Time: 1550-1611 PT Time Calculation (min) (ACUTE ONLY): 21 min  Charges:  $Gait Training: 8-22 mins                    G CodesAlessandra Bevels:      Jamonica Schoff M  Nasra Counce 10/16/2015, 5:18 PM Deborah ChalkJennifer Sarajane Fambrough, PT, DPT [phone]

## 2015-10-16 NOTE — Progress Notes (Signed)
Orthopedic Tech Progress Note Patient Details:  Oscar CharonBennie F Deleonardis Jr. 11/22/1945 [id number]  Patient ID: Oscar CharonBennie F Mori Jr., male   DOB: 01/17/1946, 70 y.o.   MRN: [id number]   Nikki DomCrawford, [name] 10/16/2015, 2:56 PM Placed pt's lle on cpm @0 -60 degrees @1450 ; RN notified

## 2015-10-16 NOTE — Evaluation (Signed)
Physical Therapy Evaluation Patient Details Name: Oscar Ward. MRN: [id number] DOB: December 13, 1945 Today's Date: 10/16/2015   History of Present Illness  Pt is a 70 y/o male s/p L TKR revision surgery. PMH including but not limited to CKD, HTN and L TKR approximately 6 years ago.  Clinical Impression  Pt presented supine in bed with HOB elevated, awake and willing to participate in therapy session. Pt's wife was present throughout. Pt reported that prior to admission he was independent with all functional mobility. Pt would continue to benefit from skilled physical therapy services at this time while admitted and after d/c to address his below listed limitations in order to improve his overall safety and independence with functional mobility.     Follow Up Recommendations Home health PT;Supervision for mobility/OOB    Equipment Recommendations  None recommended by PT    Recommendations for Other Services       Precautions / Restrictions Precautions Precautions: Fall;Knee Restrictions Weight Bearing Restrictions: Yes LLE Weight Bearing: Weight bearing as tolerated      Mobility  Bed Mobility Overal bed mobility: Needs Assistance Bed Mobility: Supine to Sit;Sit to Supine     Supine to sit: Supervision;HOB elevated Sit to supine: Supervision   General bed mobility comments: pt using assistive strap to move his L LE  Transfers Overall transfer level: Needs assistance Equipment used: Rolling walker (2 wheeled) Transfers: Sit to/from Stand Sit to Stand: Min guard         General transfer comment: pt required increased time to complete and min guard for safety  Ambulation/Gait Ambulation/Gait assistance: Min guard Ambulation Distance (Feet): 150 Feet Assistive device: Rolling walker (2 wheeled) Gait Pattern/deviations: Step-to pattern;Decreased step length - right;Decreased stance time - left;Decreased weight shift to left Gait velocity: decreased Gait velocity  interpretation: Below normal speed for age/gender    Stairs            Wheelchair Mobility    Modified Rankin (Stroke Patients Only)       Balance Overall balance assessment: Needs assistance Sitting-balance support: Feet supported;No upper extremity supported Sitting balance-Leahy Scale: Good     Standing balance support: During functional activity;Bilateral upper extremity supported Standing balance-Leahy Scale: Poor                               Pertinent Vitals/Pain Pain Assessment: Faces Faces Pain Scale: Hurts a little bit Pain Location: L lateral knee Pain Descriptors / Indicators: Burning;Discomfort;Guarding Pain Intervention(s): Monitored during session;Repositioned    Home Living Family/patient expects to be discharged to:: Private residence Living Arrangements: Spouse/significant other Available Help at Discharge: Family;Available PRN/intermittently Type of Home: House Home Access: Stairs to enter Entrance Stairs-Rails: Doctor, general practice of Steps: 4 Home Layout: One level Home Equipment: Walker - 2 wheels;Crutches;Shower seat      Prior Function Level of Independence: Independent               Hand Dominance        Extremity/Trunk Assessment   Upper Extremity Assessment: Defer to OT evaluation           Lower Extremity Assessment: LLE deficits/detail   LLE Deficits / Details: Pt with decreased strength and ROM limitations secondary to post-op.   Cervical / Trunk Assessment: Normal  Communication   Communication: No difficulties  Cognition Arousal/Alertness: Awake/alert Behavior During Therapy: WFL for tasks assessed/performed Overall Cognitive Status: Within Functional Limits for tasks assessed  General Comments      Exercises Total Joint Exercises Long Arc Quad: AROM;Strengthening;Left;10 reps;Seated Knee Flexion: AROM;Strengthening;Left;10 reps;Seated Marching  in Standing: AROM;Strengthening;Both;10 reps;Seated   Assessment/Plan    PT Assessment Patient needs continued PT services  PT Problem List Decreased strength;Decreased range of motion;Decreased balance;Decreased activity tolerance;Decreased mobility;Decreased coordination;Pain          PT Treatment Interventions DME instruction;Gait training;Stair training;Functional mobility training;Therapeutic activities;Therapeutic exercise;Balance training;Neuromuscular re-education;Patient/family education    PT Goals (Current goals can be found in the Care Plan section)  Acute Rehab PT Goals Patient Stated Goal: return home PT Goal Formulation: With patient/family Time For Goal Achievement: 10/23/15 Potential to Achieve Goals: Good    Frequency 7X/week   Barriers to discharge        Co-evaluation               End of Session Equipment Utilized During Treatment: Gait belt Activity Tolerance: Patient limited by pain;Patient limited by fatigue Patient left: in bed;with call bell/phone within reach;with family/visitor present Nurse Communication: Mobility status         Time: [id number] PT Time Calculation (min) (ACUTE ONLY): 17 min   Charges:   PT Evaluation $PT Eval Moderate Complexity: 1 Procedure     PT G CodesAlessandra Bevels:        Donnie Panik M Sefora Tietje 10/16/2015, 10:44 AM Deborah ChalkJennifer Azjah Pardo, PT, DPT [phone]

## 2015-10-17 MED ORDER — ASPIRIN 81 MG PO CHEW: 81.0000 mg | CHEWABLE_TABLET | Freq: Two times a day (BID) | ORAL | 0 refills | Status: DC

## 2015-10-17 MED ORDER — METHOCARBAMOL 500 MG PO TABS: 500.0000 mg | ORAL_TABLET | Freq: Four times a day (QID) | ORAL | 0 refills | Status: DC | PRN

## 2015-10-17 MED ORDER — OXYCODONE HCL 5 MG PO TABS: 5.0000 mg | ORAL_TABLET | ORAL | 0 refills | Status: DC | PRN

## 2015-10-17 NOTE — Progress Notes (Signed)
Pt ready for d/c home per MD. Met PT/OT goals, has needed equipment at home. Discharge instructions and prescriptions reviewed with pt and wife, all questions answered. Pt assisted to car via wheelchair with all belongings.   [place], Jerry Caras

## 2015-10-17 NOTE — Progress Notes (Signed)
Subjective: 2 Days Post-Op Procedure(s) (LRB): LEFT TOTAL KNEE REVISION, synovectomy with poly exchange (Left) Patient reports pain as moderate.  Working well with mobility.  Objective: Vital signs in last 24 hours: Temp:  [98.5 F (36.9 C)] 98.5 F (36.9 C) (09/20 2008) Pulse Rate:  [64-77] 77 (09/20 2008) Resp:  [17-18] 18 (09/20 2008) BP: (128-150)/(66-70) 150/66 (09/20 2008) SpO2:  [100 %] 100 % (09/20 2008)  Intake/Output from previous day: 09/20 0701 - 09/21 0700 In: 1030 [P.O.:1030] Out: 1040 [Urine:1040] Intake/Output this shift: No intake/output data recorded.  No results for input(s): HGB in the last 72 hours. No results for input(s): WBC, RBC, HCT, PLT in the last 72 hours. No results for input(s): NA, K, CL, CO2, BUN, CREATININE, GLUCOSE, CALCIUM in the last 72 hours. No results for input(s): LABPT, INR in the last 72 hours.  Sensation intact distally Intact pulses distally Dorsiflexion/Plantar flexion intact Incision: scant drainage No cellulitis present Compartment soft  Assessment/Plan: 2 Days Post-Op Procedure(s) (LRB): LEFT TOTAL KNEE REVISION, synovectomy with poly exchange (Left) Up with therapy Discharge home with home health today.  Oscar HitchChristopher Y Javanni Ward 10/17/2015, 7:00 AM

## 2015-10-17 NOTE — Progress Notes (Signed)
Orthopedic Tech Progress Note Patient Details:  [place] CharonBennie F Brazil Jr. 06/16/1945 [id number]  Patient ID: [place] CharonBennie F Malinowski Jr., male   DOB: 01/28/1945, 70 y.o.   MRN: [id number] Applied cpm 0-60  Trinna PostMartinez, Lameisha Schuenemann J 10/17/2015, 6:08 AM

## 2015-10-17 NOTE — Discharge Instructions (Signed)
You can get your current left knee dressing wet daily in the shower. Leave your knee dressing on until your outpatient follow-up. Expect knee swelling; ice and elevation as needed. Increase your activities as comfort allows. You can drive starting next week as long as you have a narcotic in your system.

## 2015-10-17 NOTE — Plan of Care (Signed)
Problem: Safety: Goal: Ability to remain free from injury will improve Outcome: Progressing No fall or injury noted, assisted to BR  Problem: Pain Managment: Goal: General experience of comfort will improve Outcome: Progressing Medicated twice for pain with full relief  Problem: Physical Regulation: Goal: Will remain free from infection Outcome: Progressing No s/s of infection noted, VS WNL  Problem: Tissue Perfusion: Goal: Risk factors for ineffective tissue perfusion will decrease Outcome: Progressing No s/s of dvt noted  Problem: Activity: Goal: Risk for activity intolerance will decrease Outcome: Progressing Ambulated to BR with assistance, tolerated well  Problem: Bowel/Gastric: Goal: Will not experience complications related to bowel motility Outcome: Progressing No bowel issues reported

## 2015-10-17 NOTE — Progress Notes (Signed)
Physical Therapy Treatment Patient Details Name: Oscar CharonBennie F Crossman Jr. MRN: [id number] DOB: 09/05/1945 Today's Date: 10/17/2015    History of Present Illness Pt is a 70 y/o male s/p L TKR revision surgery. PMH including but not limited to CKD, HTN and L TKR approximately 6 years ago.    PT Comments    Pt presented supine in bed with HOB elevated, awake and willing to participate in therapy session. Pt again participated in stair training during this session. PT answered all of pt and pt's wife's questions at the end of the session. Pt would continue to benefit from skilled physical therapy services at this time while admitted and after d/c to address his limitations in order to improve his safety and independence with functional mobility.   Follow Up Recommendations  Home health PT;Supervision for mobility/OOB     Equipment Recommendations  None recommended by PT    Recommendations for Other Services       Precautions / Restrictions Precautions Precautions: Fall;Knee Restrictions Weight Bearing Restrictions: Yes LLE Weight Bearing: Weight bearing as tolerated    Mobility  Bed Mobility Overal bed mobility: Needs Assistance Bed Mobility: Supine to Sit;Sit to Supine     Supine to sit: Min assist;HOB elevated Sit to supine: Min assist   General bed mobility comments: pt required increased time and min A with movement of L LE  Transfers Overall transfer level: Needs assistance Equipment used: Rolling walker (2 wheeled) Transfers: Sit to/from Stand Sit to Stand: Supervision         General transfer comment: pt required increased time to complete  Ambulation/Gait Ambulation/Gait assistance: Min guard Ambulation Distance (Feet): 100 Feet (100 ft x2 with sitting rest break in between) Assistive device: Rolling walker (2 wheeled) Gait Pattern/deviations: Step-through pattern;Decreased step length - right;Decreased stance time - left;Decreased weight shift to left Gait  velocity: decreased Gait velocity interpretation: Below normal speed for age/gender General Gait Details: pt required VC'ing for foreward gaze   Stairs Stairs: Yes Stairs assistance: Min guard Stair Management: One rail Right;Step to pattern;Forwards Number of Stairs: 2 General stair comments: pt ascended with R LE leading and descended with L LE leading  Wheelchair Mobility    Modified Rankin (Stroke Patients Only)       Balance Overall balance assessment: Needs assistance Sitting-balance support: Feet supported;No upper extremity supported Sitting balance-Leahy Scale: Good     Standing balance support: During functional activity;Bilateral upper extremity supported Standing balance-Leahy Scale: Poor                      Cognition Arousal/Alertness: Awake/alert Behavior During Therapy: WFL for tasks assessed/performed Overall Cognitive Status: Within Functional Limits for tasks assessed                      Exercises      General Comments        Pertinent Vitals/Pain Pain Assessment: 0-10 Pain Score: 5  Pain Location: L knee Pain Descriptors / Indicators: Grimacing;Guarding Pain Intervention(s): Monitored during session;Repositioned    Home Living                      Prior Function            PT Goals (current goals can now be found in the care plan section) Acute Rehab PT Goals Patient Stated Goal: return home PT Goal Formulation: With patient/family Time For Goal Achievement: 10/23/15 Potential to Achieve Goals: Good Progress towards  PT goals: Progressing toward goals    Frequency    7X/week      PT Plan Current plan remains appropriate    Co-evaluation             End of Session Equipment Utilized During Treatment: Gait belt Activity Tolerance: Patient limited by pain Patient left: in bed;with call bell/phone within reach;with family/visitor present     Time: 1358-1420 PT Time Calculation (min) (ACUTE  ONLY): 22 min  Charges:  $Gait Training: 8-22 mins                    G CodesAlessandra Bevels Dima Mini 11/02/15, 2:52 PM Deborah Chalk, PT, DPT [phone]

## 2015-10-17 NOTE — Discharge Summary (Signed)
Patient ID: Oscar Ward. MRN: [id number] DOB/AGE: [birth year] 70 y.o.  Admit date: 10/15/2015 Discharge date: 10/17/2015  Admission Diagnoses:  Principal Problem:   Failed total left knee replacement (HCC) Active Problems:   Polyethylene liner wear following total knee arthroplasty requiring isolated polyethylene liner exchange [facility])   Discharge Diagnoses:  Same  Past Medical History:  Diagnosis Date  . Arthritis   . Chronic kidney disease    only one kidney since bitth  . Fibromyalgia   . GERD (gastroesophageal reflux disease)   . Headache   . Hypertension   . PONV (postoperative nausea and vomiting)     Surgeries: Procedure(s): LEFT TOTAL KNEE REVISION, synovectomy with poly exchange on 10/15/2015   Consultants:   Discharged Condition: Improved  Hospital Course: Oscar Ward. is an 70 y.o. male who was admitted 10/15/2015 for operative treatment ofFailed total left knee replacement (HCC). Patient has severe unremitting pain that affects sleep, daily activities, and work/hobbies. After pre-op clearance the patient was taken to the operating room on 10/15/2015 and underwent  Procedure(s): LEFT TOTAL KNEE REVISION, synovectomy with poly exchange.    Patient was given perioperative antibiotics: Anti-infectives    Start     Dose/Rate Route Frequency Ordered Stop   10/15/15 2000  ceFAZolin (ANCEF) IVPB 1 g/50 mL premix     1 g 100 mL/hr over 30 Minutes Intravenous Every 6 hours 10/15/15 1755 10/16/15 0408   10/15/15 1300  ceFAZolin (ANCEF) IVPB 2g/100 mL premix     2 g 200 mL/hr over 30 Minutes Intravenous To ShortStay Surgical 10/14/15 1122 10/15/15 1430       Patient was given sequential compression devices, early ambulation, and chemoprophylaxis to prevent DVT.  Patient benefited maximally from hospital stay and there were no complications.    Recent vital signs: Patient Vitals for the past 24 hrs:  BP Temp Temp src Pulse Resp SpO2  10/16/15 2008 (!) 150/66  98.5 F (36.9 C) Oral 77 18 100 %  10/16/15 1359 128/70 98.5 F (36.9 C) Oral 64 17 100 %     Recent laboratory studies: No results for input(s): WBC, HGB, HCT, PLT, NA, K, CL, CO2, BUN, CREATININE, GLUCOSE, INR, CALCIUM in the last 72 hours.  Invalid input(s): PT, 2   Discharge Medications:     Medication List    STOP taking these medications   HYDROcodone-acetaminophen 10-325 MG tablet Commonly known as:  NORCO     TAKE these medications   aspirin 81 MG chewable tablet Chew 1 tablet (81 mg total) by mouth 2 (two) times daily.   atorvastatin 20 MG tablet Commonly known as:  LIPITOR Take 20 mg by mouth every evening.   azelastine 0.1 % nasal spray Commonly known as:  ASTELIN USE ONE-TWO SPRAYS IN EACH NOSTRIL ONCE OR TWICE DAILY AS NEEDED What changed:  how much to take  how to take this  when to take this  additional instructions   butalbital-acetaminophen-caffeine 50-325-40 MG tablet Commonly known as:  FIORICET, ESGIC Take 1 tablet by mouth as needed for headache.   cetirizine 10 MG tablet Commonly known as:  ZYRTEC Take 10 mg by mouth every evening.   losartan 100 MG tablet Commonly known as:  COZAAR Take 100 mg by mouth every evening.   methocarbamol 500 MG tablet Commonly known as:  ROBAXIN Take 1 tablet (500 mg total) by mouth every 6 (six) hours as needed for muscle spasms.   montelukast 10 MG tablet Commonly known as:  SINGULAIR Take 10 mg by mouth every evening.   multivitamin with minerals Tabs tablet Take 1 tablet by mouth daily.   oxyCODONE 5 MG immediate release tablet Commonly known as:  Oxy IR/ROXICODONE Take 1-2 tablets (5-10 mg total) by mouth every 4 (four) hours as needed for severe pain or breakthrough pain.   pantoprazole 40 MG tablet Commonly known as:  PROTONIX Take 40 mg by mouth daily.   pantoprazole 40 MG tablet Commonly known as:  PROTONIX TAKE 1 TABLET BY MOUTH EVERY DAY FOR REFLUX   ranitidine 300 MG  tablet Commonly known as:  ZANTAC TAKE 1 TABLET BY MOUTH EVERY EVENING   senna-docusate 8.6-50 MG tablet Commonly known as:  Senokot-S Take 2 tablets by mouth 2 (two) times daily.   Vitamin B-12 5000 MCG Lozg Take 1 lozenge by mouth daily.       Diagnostic Studies: Dg Knee Left Port  Result Date: 10/15/2015 CLINICAL DATA:  Left knee revision EXAM: PORTABLE LEFT KNEE - 1-2 VIEW COMPARISON:  02/01/2009 FINDINGS: Portable supine exam. Left knee arthroplasty changes noted. Components appear aligned. Postop changes of the soft tissues diffusely. No acute osseous finding or hardware abnormality. IMPRESSION: Expected appearance status post left knee arthroplasty revision. Stable hardware and alignment. Electronically Signed   By: Judie PetitM.  Shick M.D.   On: 10/15/2015 16:51    Disposition: to home  Discharge Instructions    Call MD / Call 911    Complete by:  As directed    If you experience chest pain or shortness of breath, CALL 911 and be transported to the hospital emergency room.  If you develope a fever above 101 F, pus (white drainage) or increased drainage or redness at the wound, or calf pain, call your surgeon's office.   Constipation Prevention    Complete by:  As directed    Drink plenty of fluids.  Prune juice may be helpful.  You may use a stool softener, such as Colace (over the counter) 100 mg twice a day.  Use MiraLax (over the counter) for constipation as needed.   Diet - low sodium heart healthy    Complete by:  As directed    Discharge patient    Complete by:  As directed    Increase activity slowly as tolerated    Complete by:  As directed       Follow-up Information    Kathryne Hitchhristopher Y Rozanna Cormany, MD Follow up in 2 week(s).   Specialty:  Orthopedic Surgery Contact information: [address] [place] [address] [phone]            Signed: Kathryne HitchChristopher Y Maeven Mcdougall 10/17/2015, 7:04 AM

## 2015-10-17 NOTE — Progress Notes (Signed)
Physical Therapy Treatment Patient Details Name: Oscar CharonBennie F Rog Jr. MRN: [id number] DOB: 06/05/1945 Today's Date: 10/17/2015    History of Present Illness Pt is a 70 y/o male s/p L TKR revision surgery. PMH including but not limited to CKD, HTN and L TKR approximately 6 years ago.    PT Comments    Pt presented supine in bed with HOB elevated, awake and willing to participate in therapy session. Pt's wife was present throughout session as well. Pt making good progress; however, stated that he was concerned with increased pain as compared to previous day. Pt's nurse was notified of pt's request for more pain medicine. Pt successfully completed stair training during session as well. Pt would continue to benefit from skilled physical therapy services at this time while admitted and after d/c to address his limitations in order to improve his overall safety and independence with functional mobility.   Follow Up Recommendations  Home health PT;Supervision for mobility/OOB     Equipment Recommendations  None recommended by PT    Recommendations for Other Services       Precautions / Restrictions Precautions Precautions: Fall;Knee Restrictions Weight Bearing Restrictions: Yes LLE Weight Bearing: Weight bearing as tolerated    Mobility  Bed Mobility Overal bed mobility: Needs Assistance Bed Mobility: Supine to Sit;Sit to Supine     Supine to sit: Min assist;HOB elevated Sit to supine: Min assist   General bed mobility comments: pt required increased time and min A with movement of L LE  Transfers Overall transfer level: Needs assistance Equipment used: Rolling walker (2 wheeled) Transfers: Sit to/from Stand Sit to Stand: Min guard         General transfer comment: pt required increased time to complete  Ambulation/Gait Ambulation/Gait assistance: Min guard Ambulation Distance (Feet): 100 Feet (100 ft x2 with sitting rest break in between) Assistive device: Rolling walker  (2 wheeled) Gait Pattern/deviations: Step-to pattern;Decreased step length - right;Decreased stance time - left;Decreased weight shift to left Gait velocity: decreased Gait velocity interpretation: Below normal speed for age/gender General Gait Details: pt required VC'ing for foreward gaze   Stairs Stairs: Yes Stairs assistance: Min guard Stair Management: One rail Right;Step to pattern;Forwards Number of Stairs: 2 General stair comments: pt ascended with R LE leading and descended with L LE leading  Wheelchair Mobility    Modified Rankin (Stroke Patients Only)       Balance Overall balance assessment: Needs assistance Sitting-balance support: Feet supported;No upper extremity supported Sitting balance-Leahy Scale: Good     Standing balance support: During functional activity;Bilateral upper extremity supported Standing balance-Leahy Scale: Poor                      Cognition Arousal/Alertness: Awake/alert Behavior During Therapy: WFL for tasks assessed/performed Overall Cognitive Status: Within Functional Limits for tasks assessed                      Exercises Total Joint Exercises Goniometric ROM: Flexion = 60 degrees; Extension = lacking 20 degrees to neutral; measured in sitting    General Comments        Pertinent Vitals/Pain Pain Assessment: 0-10 Pain Score: 5  Pain Location: L knee Pain Descriptors / Indicators: Grimacing;Guarding;Sore;Burning Pain Intervention(s): Monitored during session;Repositioned;Patient requesting pain meds-RN notified    Home Living                      Prior Function  PT Goals (current goals can now be found in the care plan section) Acute Rehab PT Goals Patient Stated Goal: return home PT Goal Formulation: With patient/family Time For Goal Achievement: 10/23/15 Potential to Achieve Goals: Good Progress towards PT goals: Progressing toward goals    Frequency    7X/week       PT Plan Current plan remains appropriate    Co-evaluation             End of Session Equipment Utilized During Treatment: Gait belt Activity Tolerance: Patient limited by pain Patient left: in bed;with call bell/phone within reach;with family/visitor present     Time: 0832-0902 PT Time Calculation (min) (ACUTE ONLY): 30 min  Charges:  $Gait Training: 23-37 mins                    G CodesAlessandra Bevels Shawndell Schillaci 11-10-2015, 9:41 AM Deborah Chalk, PT, DPT [phone]

## 2015-10-18 ENCOUNTER — Other Ambulatory Visit: Payer: Self-pay | Admitting: Allergy and Immunology

## 2015-10-18 ENCOUNTER — Encounter (HOSPITAL_COMMUNITY): Payer: Self-pay | Admitting: Orthopaedic Surgery

## 2015-10-30 ENCOUNTER — Inpatient Hospital Stay (INDEPENDENT_AMBULATORY_CARE_PROVIDER_SITE_OTHER): Payer: Medicare Other | Admitting: Orthopaedic Surgery

## 2015-10-30 DIAGNOSIS — T84033A Mechanical loosening of internal left knee prosthetic joint, initial encounter: Secondary | ICD-10-CM

## 2015-10-30 DIAGNOSIS — T84053A Periprosthetic osteolysis of internal prosthetic left knee joint, initial encounter: Secondary | ICD-10-CM

## 2015-10-30 DIAGNOSIS — Z96652 Presence of left artificial knee joint: Secondary | ICD-10-CM | POA: Diagnosis not present

## 2015-12-08 ENCOUNTER — Other Ambulatory Visit: Payer: Self-pay | Admitting: Allergy and Immunology

## 2015-12-09 ENCOUNTER — Ambulatory Visit (INDEPENDENT_AMBULATORY_CARE_PROVIDER_SITE_OTHER): Payer: Medicare Other | Admitting: Orthopaedic Surgery

## 2015-12-09 DIAGNOSIS — Z96652 Presence of left artificial knee joint: Secondary | ICD-10-CM

## 2015-12-09 DIAGNOSIS — T84068D Wear of articular bearing surface of other internal prosthetic joint, subsequent encounter: Secondary | ICD-10-CM

## 2015-12-09 DIAGNOSIS — Z96659 Presence of unspecified artificial knee joint: Secondary | ICD-10-CM

## 2015-12-09 NOTE — Progress Notes (Signed)
Mr. Oscar Ward is now 7 weeks status post poly-liner exchange from his left knee. He says he is doing well he has good range of motion and strength. On examination today there is no significant swelling about his knee. He has almost full extension and almost full flexion. Most importantly the knee feels ligamentously stable on exam.  This point I do not need to see him back for 6 months. At that visit I would like an AP and lateral of his left knee. We can always see him sooner if there is any issues.

## 2016-01-01 ENCOUNTER — Other Ambulatory Visit: Payer: Self-pay | Admitting: Allergy and Immunology

## 2016-01-25 ENCOUNTER — Other Ambulatory Visit: Payer: Self-pay | Admitting: Allergy and Immunology

## 2016-02-11 ENCOUNTER — Other Ambulatory Visit: Payer: Self-pay | Admitting: Allergy and Immunology

## 2016-02-21 ENCOUNTER — Other Ambulatory Visit: Payer: Self-pay | Admitting: Allergy and Immunology

## 2016-02-21 MED ORDER — CETIRIZINE HCL 10 MG PO TABS: 10.0000 mg | ORAL_TABLET | Freq: Every evening | ORAL | 5 refills | Status: DC

## 2016-02-21 NOTE — Telephone Encounter (Signed)
Script sent  

## 2016-02-21 NOTE — Telephone Encounter (Signed)
Needs a refill on cetrizine.

## 2016-03-02 ENCOUNTER — Other Ambulatory Visit: Payer: Self-pay | Admitting: Allergy and Immunology

## 2016-04-04 ENCOUNTER — Other Ambulatory Visit (INDEPENDENT_AMBULATORY_CARE_PROVIDER_SITE_OTHER): Payer: Self-pay | Admitting: Orthopaedic Surgery

## 2016-04-06 NOTE — Telephone Encounter (Signed)
Please advise 

## 2016-06-08 ENCOUNTER — Ambulatory Visit (INDEPENDENT_AMBULATORY_CARE_PROVIDER_SITE_OTHER): Payer: Medicare Other | Admitting: Orthopaedic Surgery

## 2016-06-08 ENCOUNTER — Ambulatory Visit (INDEPENDENT_AMBULATORY_CARE_PROVIDER_SITE_OTHER): Payer: Medicare Other

## 2016-06-08 DIAGNOSIS — Z96652 Presence of left artificial knee joint: Secondary | ICD-10-CM

## 2016-06-08 NOTE — Progress Notes (Signed)
Office Visit Note   Patient: Oscar CharonBennie F Abalos Jr.           Date of Birth: 04/01/1945           MRN: [id number] Visit Date: 06/08/2016              Requested by: Johny BlamerHarris, William, MD [phone] [place], [place] [address] PCP: Johny BlamerHarris, William, MD   Assessment & Plan: Visit Diagnoses:  1. Status post revision of total replacement of left knee     Plan: At this point is doing well that he can follow-up as needed. I talked about things need to bring about for his left knee but we can always see him for anything else. All questions were encouraged and answered.  Follow-Up Instructions: Return if symptoms worsen or fail to improve.   Orders:  Orders Placed This Encounter  Procedures  . XR Knee 1-2 Views Left   No orders of the defined types were placed in this encounter.     Procedures: No procedures performed   Clinical Data: No additional findings.   Subjective: No chief complaint on file. Oscar Ward is a 71 year old who is 8 months out from a left knee revision arthroplasty. This is really just a polyethylene upsizing. He has had no complaints since then. He denies any swelling in his knee. He denies any instability symptoms that he was having before. He states his range of motion strength are excellent. He has no other complaints.  HPI  Review of Systems He denies any headache, chest pain, shortness of breath, fever, chills, nausea, vomiting.  Objective: Vital Signs: There were no vitals taken for this visit.  Physical Exam He is alert and oriented 3 and in no acute distress Ortho Exam Examination of his left knee shows no effusion. His range of motion is entirely full. His knee is ligamentously stable. Specialty Comments:  No specialty comments available.  Imaging: Xr Knee 1-2 Views Left  Result Date: 06/08/2016 An AP and lateral of the left knee show well-seated implant with no complicating features. There is no effusion.    PMFS  History: Patient Active Problem List   Diagnosis Date Noted  . Failed total left knee replacement (HCC) 10/15/2015  . Polyethylene liner wear following total knee arthroplasty requiring isolated polyethylene liner exchange (HCC) 10/15/2015  . Allergic rhinoconjunctivitis 01/10/2015  . LPRD (laryngopharyngeal reflux disease) 01/10/2015  . Other headache syndrome 01/10/2015   Past Medical History:  Diagnosis Date  . Arthritis   . Chronic kidney disease    only one kidney since bitth  . Fibromyalgia   . GERD (gastroesophageal reflux disease)   . Headache   . Hypertension   . PONV (postoperative nausea and vomiting)     Family History  Problem Relation Age of Onset  . Allergic rhinitis Neg Hx   . Angioedema Neg Hx   . Asthma Neg Hx   . Atopy Neg Hx   . Eczema Neg Hx   . Immunodeficiency Neg Hx   . Urticaria Neg Hx     Past Surgical History:  Procedure Laterality Date  . ADENOIDECTOMY    . APPENDECTOMY    . CERVICAL FUSION    . CHOLECYSTECTOMY    . COLON SURGERY     megacolon decreased   . HEMORRHOID SURGERY  1985  . HERNIA REPAIR     times 2  . JOINT REPLACEMENT Left    knee  . MENISCUS REPAIR Right 2011  .  PROSTATE SURGERY  2006  . right testicle      removed  . SHOULDER SURGERY Left    Bone Spur  . SINOSCOPY    . TONSILLECTOMY    . TOTAL KNEE REVISION Left 10/15/2015   Procedure: LEFT TOTAL KNEE REVISION, synovectomy with poly exchange;  Surgeon: Kathryne Hitch, MD;  Location: [facility] OR;  Service: Orthopedics;  Laterality: Left;   Social History   Occupational History  . Not on file.   Social History Main Topics  . Smoking status: Never Smoker  . Smokeless tobacco: Never Used  . Alcohol use No     Comment: wine rarely  . Drug use: No  . Sexual activity: Not on file

## 2016-06-15 ENCOUNTER — Other Ambulatory Visit: Payer: Self-pay | Admitting: Allergy and Immunology

## 2016-06-24 ENCOUNTER — Other Ambulatory Visit: Payer: Self-pay | Admitting: Allergy and Immunology

## 2016-06-29 ENCOUNTER — Ambulatory Visit (INDEPENDENT_AMBULATORY_CARE_PROVIDER_SITE_OTHER): Payer: Medicare Other | Admitting: Allergy and Immunology

## 2016-06-29 ENCOUNTER — Encounter: Payer: Self-pay | Admitting: Allergy and Immunology

## 2016-06-29 VITALS — BP 130/72 | HR 64 | Resp 16

## 2016-06-29 DIAGNOSIS — J3089 Other allergic rhinitis: Secondary | ICD-10-CM | POA: Diagnosis not present

## 2016-06-29 DIAGNOSIS — K219 Gastro-esophageal reflux disease without esophagitis: Secondary | ICD-10-CM | POA: Diagnosis not present

## 2016-06-29 MED ORDER — PANTOPRAZOLE SODIUM 40 MG PO TBEC: DELAYED_RELEASE_TABLET | ORAL | 3 refills | Status: DC

## 2016-06-29 MED ORDER — RANITIDINE HCL 300 MG PO TABS: 300.0000 mg | ORAL_TABLET | Freq: Every evening | ORAL | 3 refills | Status: DC

## 2016-06-29 MED ORDER — AZELASTINE HCL 0.1 % NA SOLN: 2.0000 | Freq: Two times a day (BID) | NASAL | 3 refills | Status: DC

## 2016-06-29 MED ORDER — MONTELUKAST SODIUM 10 MG PO TABS: 10.0000 mg | ORAL_TABLET | Freq: Every day | ORAL | 3 refills | Status: DC

## 2016-06-29 NOTE — Progress Notes (Signed)
Follow-up Note    Referring Provider: Johny BlamerHarris, William, MD Primary Provider: Johny BlamerHarris, William, MD Date of Office Visit: 06/29/2016  Subjective:   Oscar CharonBennie F Dominy Jr. (DOB: 10/22/1945) is a 71 y.o. male who [facility] in re-evaluation of the following:  HPI: Oscar Ward returns to this clinic in reevaluation of allergic rhinitis and LPR. His last visit to this clinic was 1 year ago.  Overall he has really done very well as long as he continues to use his medications.  Since he has run out of Astelin this past month he has lots more nasal congestion. Since he has run out of ranitidine this past month he has had more problems with regurgitation.  When using medications on a consistent basis he had very little problems with his nose while consistently using his Astelin and his montelukast. He has not required a systemic steroid or antibiotic to treat any respiratory tract issue.  Likewise while consistently using pantoprazole and ranitidine he has very good control of all his reflux and regurgitation. He still continues to drink 3 coffees per day.  He believes that when he eats dairy he may get a little bit more mucus and nasal congestion. For example, if he eats ice cream at nighttime he wakes up in the morning with more nasal congestion and mucus.  Allergies as of 06/29/2016      Reactions   Marcaine [bupivacaine Hcl] Rash      Medication List      aspirin 81 MG chewable tablet CHEW 1 TABLET BY MOUTH TWICE DAILY   atorvastatin 20 MG tablet Commonly known as:  LIPITOR Take 20 mg by mouth every evening.   azelastine 0.1 % nasal spray Commonly known as:  ASTELIN USE 1 TO 2 SPRAYS IN EACH NOSTRIL ONCE OR TWICE DAILY AS NEEDED   butalbital-acetaminophen-caffeine 50-325-40 MG tablet Commonly known as:  FIORICET, ESGIC Take 1 tablet by mouth as needed for headache.   cetirizine 10 MG tablet Commonly known as:  ZYRTEC Take 1 tablet (10 mg  total) by mouth every evening.   losartan 100 MG tablet Commonly known as:  COZAAR Take 100 mg by mouth every evening.   methocarbamol 500 MG tablet Commonly known as:  ROBAXIN TAKE 1 TABLET BY MOUTH EVERY 6 HOURS AS NEEDED FOR MUSCLE SPASMS   montelukast 10 MG tablet Commonly known as:  SINGULAIR TAKE 1 TABLET BY MOUTH EVERY DAY AS DIRECTED   multivitamin with minerals Tabs tablet Take 1 tablet by mouth daily.   oxyCODONE 5 MG immediate release tablet Commonly known as:  Oxy IR/ROXICODONE Take 1-2 tablets (5-10 mg total) by mouth every 4 (four) hours as needed for severe pain or breakthrough pain.   pantoprazole 40 MG tablet Commonly known as:  PROTONIX TAKE 1 TABLET BY MOUTH EVERY DAY FOR REFLUX   ranitidine 300 MG tablet Commonly known as:  ZANTAC TAKE 1 TABLET BY MOUTH EVERY EVENING   senna-docusate 8.6-50 MG tablet Commonly known as:  Senokot-S Take 2 tablets by mouth 2 (two) times daily.   Vitamin B-12 5000 MCG Lozg Take 1 lozenge by mouth daily.       Past Medical History:  Diagnosis Date  . Arthritis   . Chronic kidney disease    only one kidney since bitth  . Fibromyalgia   . GERD (gastroesophageal reflux disease)   . Headache   . Hypertension   . PONV (postoperative nausea and vomiting)  Past Surgical History:  Procedure Laterality Date  . ADENOIDECTOMY    . APPENDECTOMY    . CERVICAL FUSION    . CHOLECYSTECTOMY    . COLON SURGERY     megacolon decreased   . HEMORRHOID SURGERY  1985  . HERNIA REPAIR     times 2  . JOINT REPLACEMENT Left    knee  . MENISCUS REPAIR Right 2011  . PROSTATE SURGERY  2006  . right testicle      removed  . SHOULDER SURGERY Left    Bone Spur  . SINOSCOPY    . TONSILLECTOMY    . TOTAL KNEE REVISION Left 10/15/2015   Procedure: LEFT TOTAL KNEE REVISION, synovectomy with poly exchange;  Surgeon: Kathryne Hitch, MD;  Location: [facility] OR;  Service: Orthopedics;  Laterality: Left;    Review of systems  negative except as noted in HPI / PMHx or noted below:  Review of Systems  Constitutional: Negative.   HENT: Negative.   Eyes: Negative.   Respiratory: Negative.   Cardiovascular: Negative.   Gastrointestinal: Negative.   Genitourinary: Negative.   Musculoskeletal: Negative.   Skin: Negative.   Neurological: Negative.   Endo/Heme/Allergies: Negative.   Psychiatric/Behavioral: Negative.      Objective:   Vitals:   06/29/16 1057  BP: 130/72  Pulse: 64  Resp: 16          Physical Exam  Constitutional: He is well-developed, well-nourished, and in no distress.  HENT:  Head: Normocephalic.  Right Ear: External ear normal.  Left Ear: External ear normal.  Nose: Nose normal. No mucosal edema or rhinorrhea.  Mouth/Throat: Uvula is midline, oropharynx is clear and moist and mucous membranes are normal. No oropharyngeal exudate.  Eyes: Conjunctivae are normal.  Neck: Trachea normal. No tracheal tenderness present. No tracheal deviation present. No thyromegaly present.  Cardiovascular: Normal rate, regular rhythm, S1 normal, S2 normal and normal heart sounds.   No murmur heard. Pulmonary/Chest: Breath sounds normal. No stridor. No respiratory distress. He has no wheezes. He has no rales.  Musculoskeletal: He exhibits no edema.  Lymphadenopathy:       Head (right side): No tonsillar adenopathy present.       Head (left side): No tonsillar adenopathy present.    He has no cervical adenopathy.  Neurological: He is alert. Gait normal.  Skin: No rash noted. He is not diaphoretic. No erythema. Nails show no clubbing.  Psychiatric: Mood and affect normal.    Diagnostics: none   Assessment and Plan:   1. Other allergic rhinitis   2. LPRD (laryngopharyngeal reflux disease)     1. Continue montelukast 10mg  daily and Astelin 2 sprays each nostril twice a day  2. Continue Pantoprazole and Ranitidine  3. Continue nasal saline and cetirizine if needed  4. Work towards removing  all caffeine and chocolate  5. Consider dairy free diet for 4-8 weeks  6. Return in 12 months  or earlier if problem  Overall Oscar Ward does well while using his medications for inflammation and reflux and I will renew his medications today and see him back in this clinic in 1 year. I did have a talk with him today about attempting to minimize caffeine consumption as this is probably contributing to his reflux and he does have a history of chronic headache that probably also has a caffeine inducing component. As well, he asked me today about whether or not dairy may be contributing to some of his respiratory tract symptoms and I  told him to undergo elimination diet for 4-8 weeks and then restart dairy and see if there is a difference while performing this diet and then make a decision about whether or not he should consume dairy. If everything goes well I will see him back in this clinic in 1 year or earlier if there is a problem.  Laurette Schimke, MD Allergy / Immunology [facility]

## 2016-06-29 NOTE — Patient Instructions (Addendum)
  1. Continue montelukast 10mg  daily and Astelin 2 sprays each nostril twice a day  2. Continue Pantoprazole and Ranitidine  3. Continue nasal saline and cetirizine if needed  4. Work towards removing all caffeine and chocolate  5. Consider dairy free diet for 4-8 weeks  6. Return in 12 months  or earlier if problem

## 2016-07-13 ENCOUNTER — Ambulatory Visit: Payer: Medicare Other | Admitting: Allergy and Immunology

## 2016-08-03 ENCOUNTER — Telehealth: Payer: Self-pay

## 2016-08-03 MED ORDER — CETIRIZINE HCL 10 MG PO TABS: 10.0000 mg | ORAL_TABLET | Freq: Every evening | ORAL | 2 refills | Status: DC

## 2016-08-03 NOTE — Telephone Encounter (Signed)
Cetirizine 90 day supply requested.

## 2017-06-18 ENCOUNTER — Other Ambulatory Visit: Payer: Self-pay | Admitting: Allergy and Immunology

## 2017-06-28 ENCOUNTER — Ambulatory Visit: Payer: Self-pay | Admitting: Allergy and Immunology

## 2017-06-29 ENCOUNTER — Other Ambulatory Visit: Payer: Self-pay | Admitting: Allergy and Immunology

## 2017-07-01 ENCOUNTER — Other Ambulatory Visit: Payer: Self-pay | Admitting: Allergy and Immunology

## 2017-07-01 ENCOUNTER — Other Ambulatory Visit: Payer: Self-pay

## 2017-07-01 ENCOUNTER — Telehealth: Payer: Self-pay | Admitting: Allergy and Immunology

## 2017-07-01 MED ORDER — RANITIDINE HCL 300 MG PO TABS: 300.0000 mg | ORAL_TABLET | Freq: Every evening | ORAL | 0 refills | Status: DC

## 2017-07-01 MED ORDER — PANTOPRAZOLE SODIUM 40 MG PO TBEC: DELAYED_RELEASE_TABLET | ORAL | 0 refills | Status: DC

## 2017-07-01 NOTE — Telephone Encounter (Signed)
Oscar Ward called in and stated he would like a 30 day supply of Ranitidine and Protonix.  He has his yearly appointment at the end of the month and just needs enough to make it to his appointment.  [facility] [place]

## 2017-07-01 NOTE — Telephone Encounter (Signed)
30 day ERX for ran[facility]idine and Pro[facility] [address]

## 2017-07-22 ENCOUNTER — Ambulatory Visit: Payer: Medicare Other | Admitting: Allergy and Immunology

## 2017-07-22 ENCOUNTER — Encounter: Payer: Self-pay | Admitting: Allergy and Immunology

## 2017-07-22 VITALS — BP 120/80 | HR 52 | Resp 16

## 2017-07-22 DIAGNOSIS — K219 Gastro-esophageal reflux disease without esophagitis: Secondary | ICD-10-CM

## 2017-07-22 DIAGNOSIS — J3089 Other allergic rhinitis: Secondary | ICD-10-CM | POA: Diagnosis not present

## 2017-07-22 MED ORDER — ALLEGRA ALLERGY 180 MG PO TABS: ORAL_TABLET | ORAL | 11 refills | Status: DC

## 2017-07-22 NOTE — Progress Notes (Signed)
Follow-up Note  Referring Provider: Johny Blamer, MD Primary Provider: Johny Blamer, MD Date of Office Visit: 07/22/2017  Subjective:   Oscar Charon. (DOB: Aug 04, 1945) is a 72 y.o. male [facility] in re-evaluation of the following:  HPI: Oscar Ward presents to this clinic in evaluation of his allergic rhinitis and LPR.  His last visit to this clinic was 29 June 2016.  He had a sinus infection at the tail end of May that extended into the beginning of June and required initially amoxicillin and then Augmentin and prednisone at the urgent care center.  Fortunately, that was his only major flareup with his respiratory tract in a year and was his only requirement for utilizing a systemic steroid or antibiotic.  For the most part he has really felt that he has very good control of his airway issue by consistently using a combination of therapy.  He uses montelukast and Astelin and Allegra and cetirizine.  Currently he is using the branded Allegra in the morning and cetirizine in the evening.  As well, when I last saw him in his clinic he mentioned that when he drinks dairy he gets a little bit more mucus and nasal congestion and he has gone dairy free and this has helped him significantly as well.  His reflux appears to be under very good control at this point in time on his current plan of a proton pump inhibitor and an H2 receptor blocker.  Allergies as of 07/22/2017      Reactions   Marcaine [bupivacaine Hcl] Rash      Medication List      ALLEGRA ALLERGY 180 MG tablet Generic drug:  fexofenadine Take 180 mg by mouth daily.   atorvastatin 20 MG tablet Commonly known as:  LIPITOR Take 20 mg by mouth every evening.   azelastine 0.1 % nasal spray Commonly known as:  ASTELIN Place 2 sprays into both nostrils 2 (two) times daily. Use in each nostril as directed   butalbital-acetaminophen-caffeine 50-325-40 MG tablet Commonly  known as:  FIORICET, ESGIC Take 1 tablet by mouth as needed for headache.   HYDROcodone-acetaminophen 10-325 MG tablet Commonly known as:  NORCO TK 1 T PO Q 4 TO 6 H PRN P   IRON PO Take 325 mg by mouth daily.   losartan 100 MG tablet Commonly known as:  COZAAR Take 100 mg by mouth every evening.   montelukast 10 MG tablet Commonly known as:  SINGULAIR Take 1 tablet (10 mg total) by mouth daily.   multivitamin with minerals Tabs tablet Take 1 tablet by mouth daily.   pantoprazole 40 MG tablet Commonly known as:  PROTONIX TAKE 1 TABLET BY MOUTH EVERY DAY FOR REFLUX   ranitidine 300 MG tablet Commonly known as:  ZANTAC Take 1 tablet (300 mg total) by mouth every evening.   SENNA S PO Take by mouth.       Past Medical History:  Diagnosis Date  . Arthritis   . Chronic kidney disease    only one kidney since bitth  . Fibromyalgia   . GERD (gastroesophageal reflux disease)   . Headache   . Hypertension   . PONV (postoperative nausea and vomiting)     Past Surgical History:  Procedure Laterality Date  . ADENOIDECTOMY    . APPENDECTOMY    . CERVICAL FUSION    . CHOLECYSTECTOMY    . COLON SURGERY     megacolon  decreased   . HEMORRHOID SURGERY  1985  . HERNIA REPAIR     times 2  . JOINT REPLACEMENT Left    knee  . MENISCUS REPAIR Right 2011  . PROSTATE SURGERY  2006  . right testicle      removed  . SHOULDER SURGERY Left    Bone Spur  . SINOSCOPY    . TONSILLECTOMY    . TOTAL KNEE REVISION Left 10/15/2015   Procedure: LEFT TOTAL KNEE REVISION, synovectomy with poly exchange;  Surgeon: Kathryne Hitchhristopher Y Blackman, MD;  Location: [facility];  Service: Orthopedics;  Laterality: Left;    Review of systems negative except as noted in HPI / PMHx or noted below:  Review of Systems  Constitutional: Negative.   HENT: Negative.   Eyes: Negative.   Respiratory: Negative.   Cardiovascular: Negative.   Gastrointestinal: Negative.   Genitourinary: Negative.     Musculoskeletal: Negative.   Skin: Negative.   Neurological: Negative.   Endo/Heme/Allergies: Negative.   Psychiatric/Behavioral: Negative.      Objective:   Vitals:   07/22/17 1555  BP: 120/80  Pulse: (!) 52  Resp: 16  SpO2: 98%          Physical Exam  HENT:  Head: Normocephalic.  Right Ear: Tympanic membrane, external ear and ear canal normal.  Left Ear: Tympanic membrane, external ear and ear canal normal.  Nose: Nose normal. No mucosal edema or rhinorrhea.  Mouth/Throat: Uvula is midline, oropharynx is clear and moist and mucous membranes are normal. No oropharyngeal exudate.  Bilateral hearing aid  Eyes: Conjunctivae are normal.  Neck: Trachea normal. No tracheal tenderness present. No tracheal deviation present. No thyromegaly present.  Cardiovascular: Normal rate, regular rhythm, S1 normal, S2 normal and normal heart sounds.  No murmur heard. Pulmonary/Chest: Breath sounds normal. No stridor. No respiratory distress. He has no wheezes. He has no rales.  Musculoskeletal: He exhibits no edema.  Lymphadenopathy:       Head (right side): No tonsillar adenopathy present.       Head (left side): No tonsillar adenopathy present.    He has no cervical adenopathy.  Neurological: He is alert.  Skin: No rash noted. He is not diaphoretic. No erythema. Nails show no clubbing.    Diagnostics:   Assessment and Plan:   1. Other allergic rhinitis   2. LPRD (laryngopharyngeal reflux disease)     1. Continue montelukast 10mg  daily and Astelin 2 sprays each nostril twice a day  2. Continue Pantoprazole and Ranitidine  3. Continue Allegra 180 (Brand) in AM and Cetirizine 10mg  in PM (prescriptions)    4. Work towards removing all caffeine and chocolate  5. Continue dairy free diet  6. Return in 12 months  or earlier if problem  7. Obtain fall flu vaccine  Oscar Ward appears to be doing relatively well on his collection of medical therapy directed against respiratory tract  inflammation and reflux and he will continue on this plan and I will see him back in this clinic in 1 year or earlier if there is a problem.  Laurette SchimkeEric Kozlow, MD Allergy / Immunology [facility]

## 2017-07-22 NOTE — Patient Instructions (Signed)
  1. Continue montelukast 10mg  daily and Astelin 2 sprays each nostril twice a day  2. Continue Pantoprazole and Ranitidine  3. Continue Allegra 180 (Brand) in AM and Cetirizine 10mg  in PM (prescriptions)    4. Work towards removing all caffeine and chocolate  5. Continue dairy free diet  6. Return in 12 months  or earlier if problem  7. Obtain fall flu vaccine

## 2017-07-23 ENCOUNTER — Other Ambulatory Visit: Payer: Self-pay | Admitting: Allergy and Immunology

## 2017-07-26 ENCOUNTER — Encounter: Payer: Self-pay | Admitting: Allergy and Immunology

## 2017-07-27 ENCOUNTER — Other Ambulatory Visit: Payer: Self-pay | Admitting: Allergy and Immunology

## 2017-09-13 ENCOUNTER — Other Ambulatory Visit: Payer: Self-pay | Admitting: Allergy and Immunology

## 2017-11-08 ENCOUNTER — Encounter: Payer: Self-pay | Admitting: Allergy and Immunology

## 2017-11-08 ENCOUNTER — Ambulatory Visit: Payer: Medicare Other | Admitting: Allergy and Immunology

## 2017-11-08 VITALS — BP 130/78 | HR 60 | Resp 18

## 2017-11-08 DIAGNOSIS — K219 Gastro-esophageal reflux disease without esophagitis: Secondary | ICD-10-CM

## 2017-11-08 DIAGNOSIS — J3089 Other allergic rhinitis: Secondary | ICD-10-CM

## 2017-11-08 MED ORDER — AZELASTINE HCL 0.1 % NA SOLN: NASAL | 3 refills | Status: DC

## 2017-11-08 MED ORDER — ALLEGRA ALLERGY 180 MG PO TABS: ORAL_TABLET | ORAL | 3 refills | Status: DC

## 2017-11-08 MED ORDER — FAMOTIDINE 40 MG PO TABS: ORAL_TABLET | ORAL | 3 refills | Status: DC

## 2017-11-08 MED ORDER — CETIRIZINE HCL 10 MG PO TABS: ORAL_TABLET | ORAL | 3 refills | Status: DC

## 2017-11-08 NOTE — Patient Instructions (Addendum)
  1. Continue montelukast 10mg  daily and Astelin 2 sprays each nostril twice a day  2. Continue Pantoprazole and Ranitidine 150-300 / famotidine 20-40  3. Continue Allegra 180 (Brand) in AM and Cetirizine 10mg  in PM (prescriptions)    4.  For this most recent episode utilize the following:   A.  Nasal saline multiple times a day  B.  Prednisone 10 mg tablet 1 time per day for 5 days only  C.  OTC ibuprofen or similar  D.  OTC Mucinex  5. Return in 12 months  or earlier if problem  7. Obtain fall flu vaccine

## 2017-11-08 NOTE — Progress Notes (Signed)
Follow-up Note  Referring Provider: Johny Blamer, MD Primary Provider: Johny Blamer, MD Date of Office Visit: 11/08/2017  Subjective:   Oscar Charon. (DOB: November 06, 1945) is a 72 y.o. male who [facility] 11[facility]2017 in re-evaluation of the following:  HPI: Oscar Ward returns to this clinic in reevaluation of his allergic rhinitis and LPR.  His last visit to this clinic was 22 July 2017.  Oscar Ward was doing very well until 6 days ago at which point in time he developed nasal discharge that was clear and lots of congestion and just feeling bad in general.  He has progressed to develop a low-grade headache and burning airways and he had a little bit of a bleed on his left nasal airway.  He has had a little bit of change in voice as well.  He has had no other new symptoms develop with this syndrome.  He does not remember being around anyone ill.  Prior to this event he was doing very well regarding his airway and his reflux disease on his current medical therapy which includes a leukotriene modifier and nasal antihistamine and a proton pump inhibitor and H2 receptor blocker.  He did discontinue his ranitidine recently because of the media attention regarding possible contamination of these drugs.  Allergies as of 11/08/2017      Reactions   Marcaine [bupivacaine Hcl] Rash      Medication List      ALLEGRA ALLERGY 180 MG tablet Generic drug:  fexofenadine Take one tablet by mouth once every morning.   atorvastatin 20 MG tablet Commonly known as:  LIPITOR Take 20 mg by mouth every evening.   azelastine 0.1 % nasal spray Commonly known as:  ASTELIN USE 2 SPRAYS IN EACH NOSTRIL TWICE DAILY AS DIRECTED   butalbital-acetaminophen-caffeine 50-325-40 MG tablet Commonly known as:  FIORICET, ESGIC Take 1 tablet by mouth as needed for headache.   HYDROcodone-acetaminophen 10-325 MG tablet Commonly known as:  NORCO TK 1 T PO Q 4 TO 6 H PRN P   IRON  PO Take 325 mg by mouth daily.   losartan 100 MG tablet Commonly known as:  COZAAR Take 100 mg by mouth every evening.   montelukast 10 MG tablet Commonly known as:  SINGULAIR TAKE 1 TABLET(10 MG) BY MOUTH DAILY   multivitamin with minerals Tabs tablet Take 1 tablet by mouth daily.   pantoprazole 40 MG tablet Commonly known as:  PROTONIX TAKE 1 TABLET BY MOUTH EVERY DAY FOR REFLUX   SENNA S PO Take by mouth.       Past Medical History:  Diagnosis Date  . Arthritis   . Chronic kidney disease    only one kidney since bitth  . Fibromyalgia   . GERD (gastroesophageal reflux disease)   . Headache   . Hypertension   . PONV (postoperative nausea and vomiting)     Past Surgical History:  Procedure Laterality Date  . ADENOIDECTOMY    . APPENDECTOMY    . CERVICAL FUSION    . CHOLECYSTECTOMY    . COLON SURGERY     megacolon decreased   . HEMORRHOID SURGERY  1985  . HERNIA REPAIR     times 2  . JOINT REPLACEMENT Left    knee  . MENISCUS REPAIR Right 2011  . PROSTATE SURGERY  2006  . right testicle      removed  . SHOULDER SURGERY Left    Bone Spur  .  SINOSCOPY    . TONSILLECTOMY    . TOTAL KNEE REVISION Left 10/15/2015   Procedure: LEFT TOTAL KNEE REVISION, synovectomy with poly exchange;  Surgeon: Kathryne Hitch, MD;  Location: [facility];  Service: Orthopedics;  Laterality: Left;    Review of systems negative except as noted in HPI / PMHx or noted below:  Review of Systems  Constitutional: Negative.   HENT: Negative.   Eyes: Negative.   Respiratory: Negative.   Cardiovascular: Negative.   Gastrointestinal: Negative.   Genitourinary: Negative.   Musculoskeletal: Negative.   Skin: Negative.   Neurological: Negative.   Endo/Heme/Allergies: Negative.   Psychiatric/Behavioral: Negative.      Objective:   Vitals:   11/08/17 1445  BP: 130/78  Pulse: 60  Resp: 18          Physical Exam  HENT:  Head: Normocephalic.  Right Ear: Tympanic  membrane, external ear and ear canal normal.  Left Ear: Tympanic membrane, external ear and ear canal normal.  Nose: Mucosal edema (Erythematous) present. No rhinorrhea.  Mouth/Throat: Uvula is midline, oropharynx is clear and moist and mucous membranes are normal. No oropharyngeal exudate.  Eyes: Conjunctivae are normal.  Neck: Trachea normal. No tracheal tenderness present. No tracheal deviation present. No thyromegaly present.  Cardiovascular: Normal rate, regular rhythm, S1 normal, S2 normal and normal heart sounds.  No murmur heard. Pulmonary/Chest: Breath sounds normal. No stridor. No respiratory distress. He has no wheezes. He has no rales.  Musculoskeletal: He exhibits no edema.  Lymphadenopathy:       Head (right side): No tonsillar adenopathy present.       Head (left side): No tonsillar adenopathy present.    He has no cervical adenopathy.  Neurological: He is alert.  Skin: No rash noted. He is not diaphoretic. No erythema. Nails show no clubbing.    Diagnostics: none   Assessment and Plan:   1. Other allergic rhinitis   2. LPRD (laryngopharyngeal reflux disease)     1. Continue montelukast 10mg  daily and Astelin 2 sprays each nostril twice a day  2. Continue Pantoprazole and Ranitidine 150-300 / famotidine 20-40  3. Continue Allegra 180 (Brand) in AM and Cetirizine 10mg  in PM (prescriptions)    4.  For this most recent episode utilize the following:   A.  Nasal saline multiple times a day  B.  Prednisone 10 mg tablet 1 time per day for 5 days only  C.  OTC ibuprofen or similar  D.  OTC Mucinex  5. Return in 12 months  or earlier if problem  7. Obtain fall flu vaccine  Oscar Ward appears to have developed a viral respiratory tract infection and we will treat him conservatively as noted above regarding this issue and withhold any antibiotics.  It should be noted that he did request an antibiotic today and was disappointed that he did not receive one.  Assuming he does  well and resolves this issue within the next week or so he will continue to utilize his therapy directed against allergic rhinitis and LPR as noted above.  I will see him back in this clinic in 12 months or earlier if there is a problem.  Laurette Schimke, MD Allergy / Immunology [facility]

## 2017-11-09 ENCOUNTER — Encounter: Payer: Self-pay | Admitting: Allergy and Immunology

## 2017-11-13 ENCOUNTER — Other Ambulatory Visit: Payer: Self-pay | Admitting: Allergy

## 2017-11-13 MED ORDER — AMOXICILLIN-POT CLAVULANATE 875-125 MG PO TABS: 1.0000 | ORAL_TABLET | Freq: Two times a day (BID) | ORAL | 0 refills | Status: AC

## 2017-11-13 NOTE — Progress Notes (Signed)
Called by pt.  Saw Dr. Lucie Leather on Monday of this week for sinus symptoms.  He was treated with prednisone course however pt states he symptoms have continued to worsen throughout the week despite prednisone.  He states he has been having chills, pressure and burning sensation behind the eye/inbetween his nose as well as think yellow drainage from performing nasal saline rinse.  At this time with 6 days of worsening symptoms and complete 5 day course of prednisone 10mg  will prescribe Augmentin 875mg  BID x 10days for sinus infection.

## 2017-12-08 ENCOUNTER — Other Ambulatory Visit: Payer: Self-pay | Admitting: Allergy and Immunology

## 2018-05-29 IMAGING — CR DG KNEE 1-2V PORT*L*
2 series · 2 of 2 positions shown · non-contrast
Comparison: 02/01/2009

CLINICAL DATA: Left knee revision

EXAM:
PORTABLE LEFT KNEE - 1-2 VIEW

[AP]
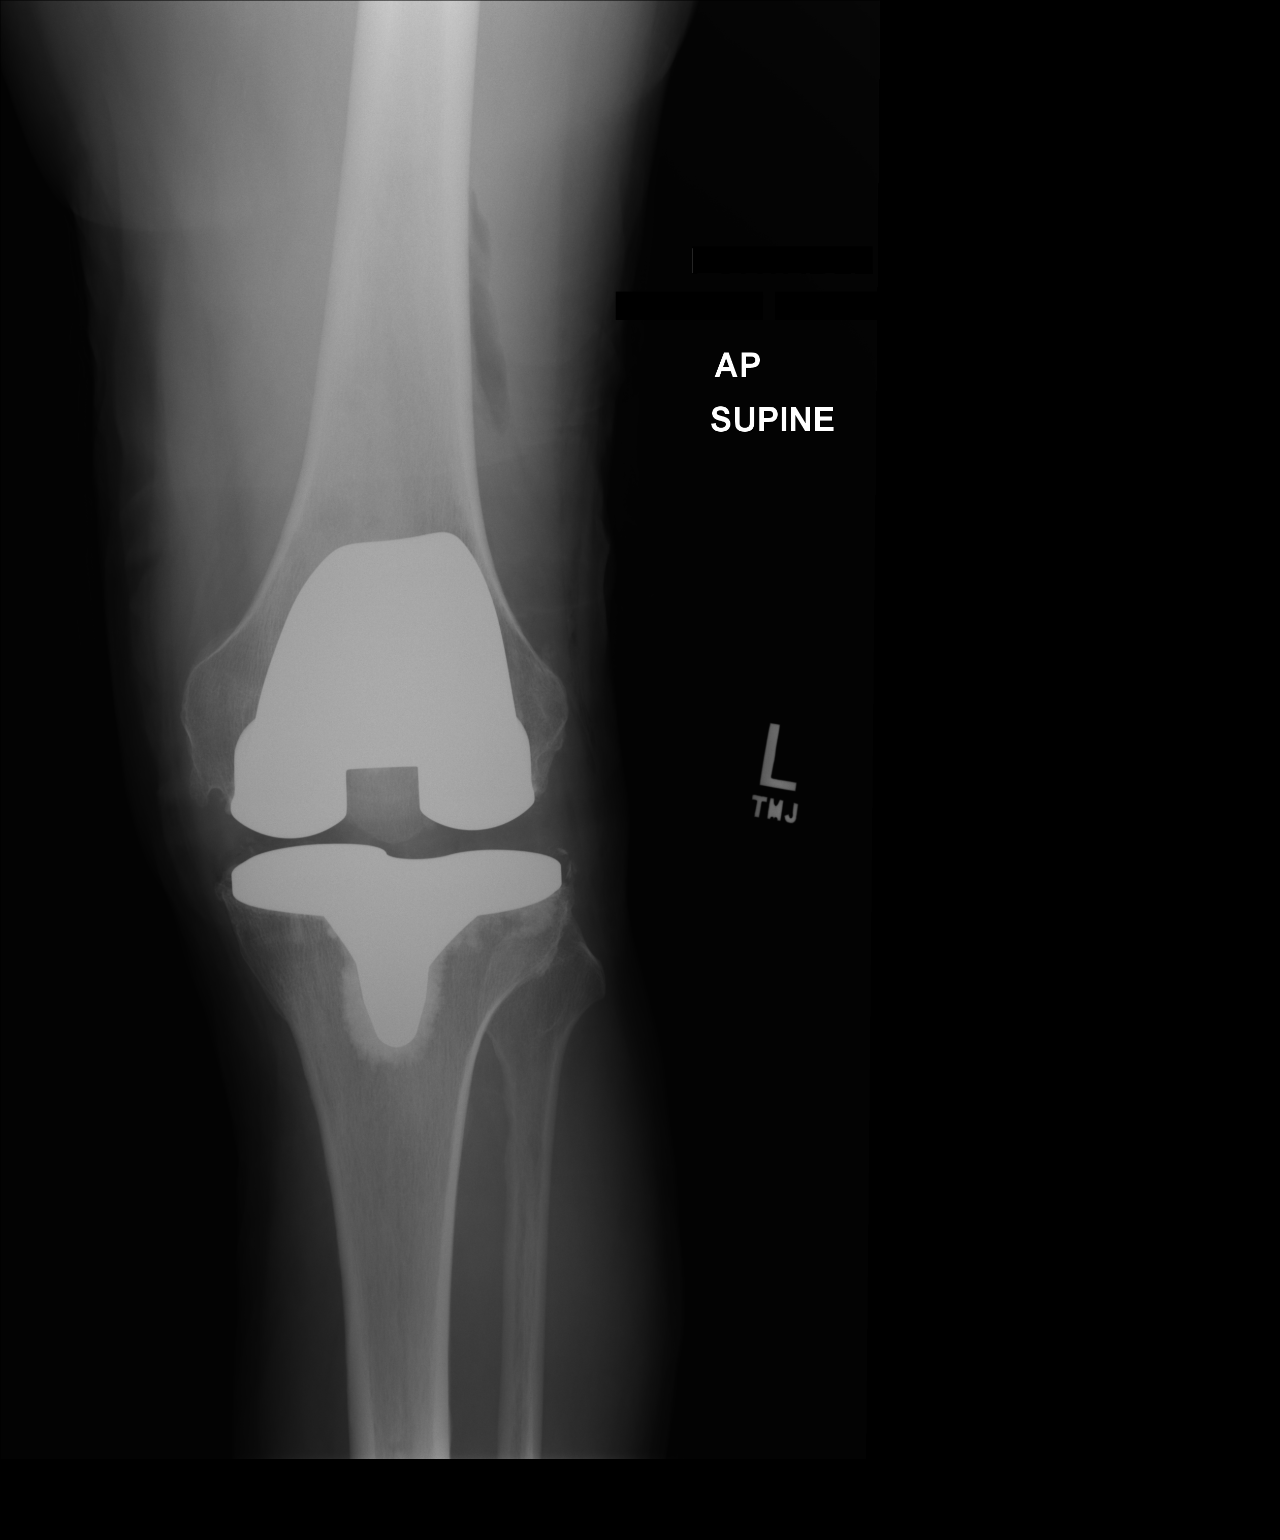

[xtable lateral]
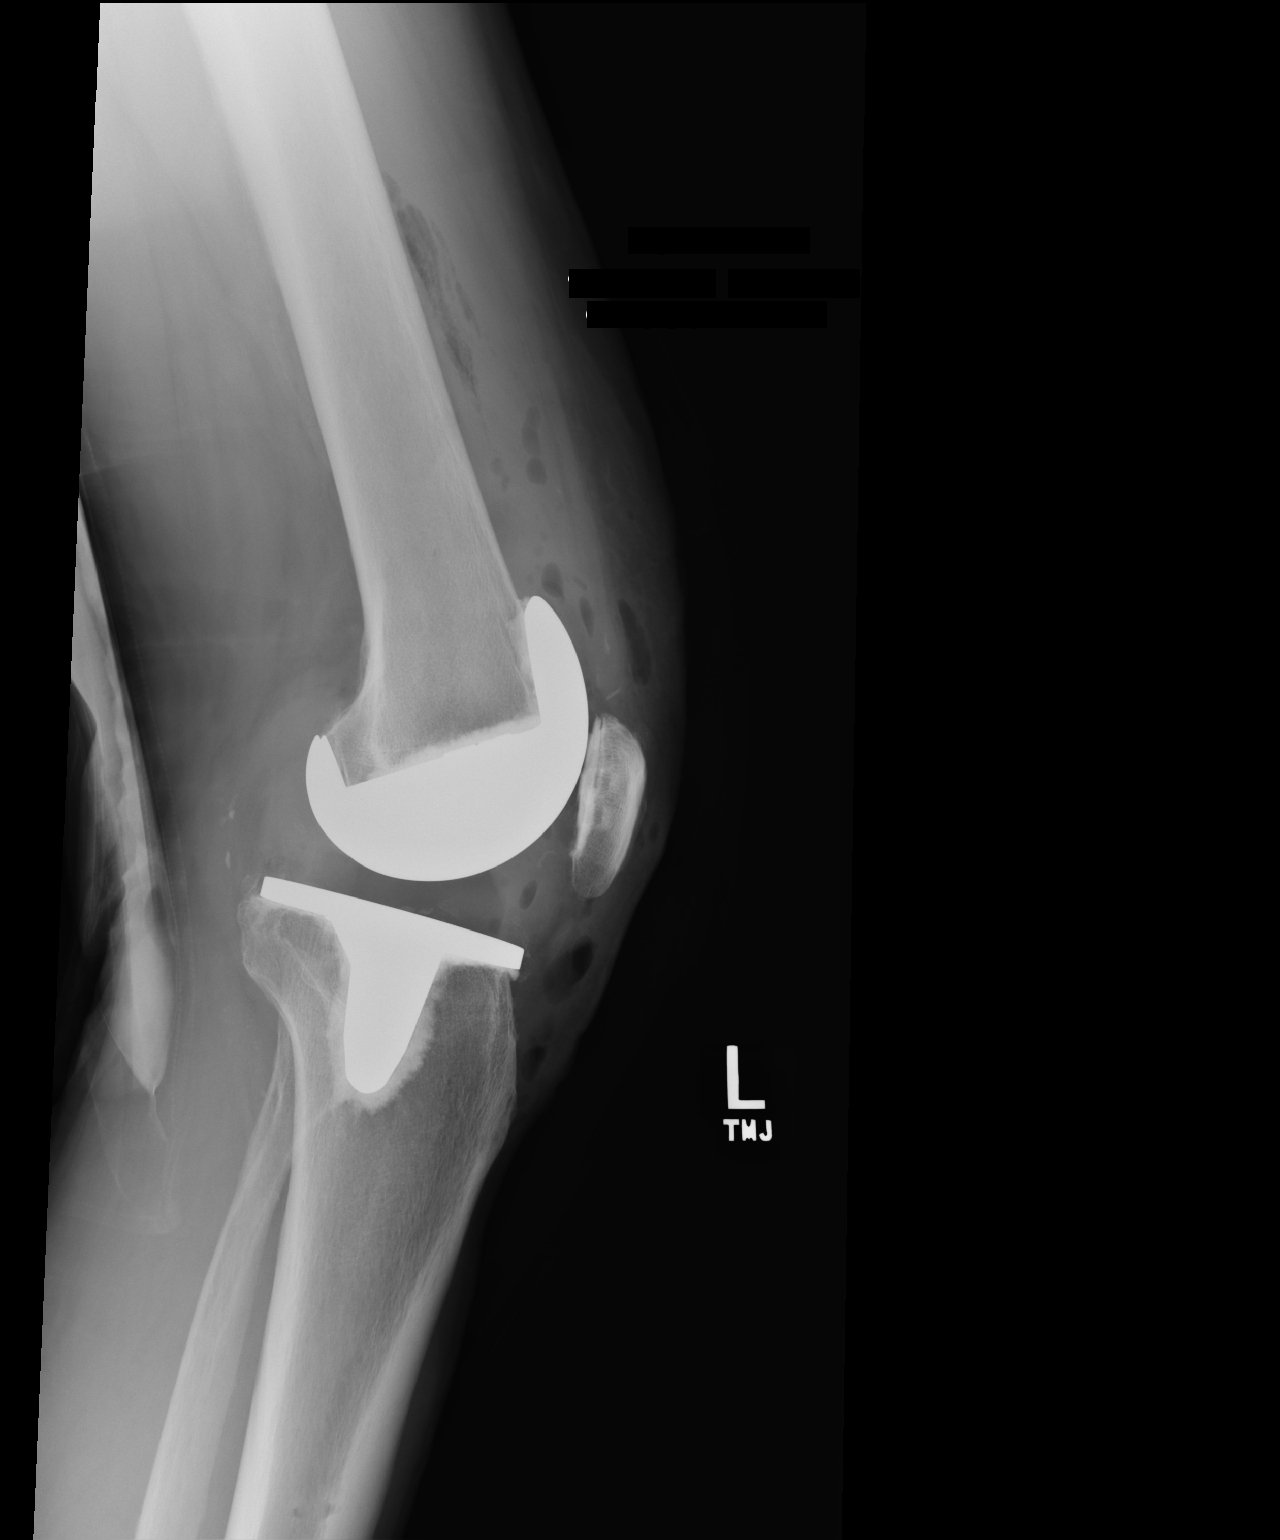

[2 of 2 positions shown; findings below may reference images not displayed]

FINDINGS: Portable supine exam. Left knee arthroplasty changes noted.
Components appear aligned. Postop changes of the soft tissues
diffusely. No acute osseous finding or hardware abnormality.
IMPRESSION: Expected appearance status post left knee arthroplasty revision.

Stable hardware and alignment.

## 2018-06-04 ENCOUNTER — Other Ambulatory Visit: Payer: Self-pay | Admitting: Allergy and Immunology

## 2018-07-20 ENCOUNTER — Ambulatory Visit: Payer: Medicare Other | Admitting: Allergy and Immunology

## 2018-08-10 ENCOUNTER — Encounter: Payer: Self-pay | Admitting: Allergy and Immunology

## 2018-08-10 ENCOUNTER — Ambulatory Visit (INDEPENDENT_AMBULATORY_CARE_PROVIDER_SITE_OTHER): Payer: Medicare Other | Admitting: Allergy and Immunology

## 2018-08-10 ENCOUNTER — Other Ambulatory Visit: Payer: Self-pay

## 2018-08-10 VITALS — BP 122/84 | HR 61 | Temp 98.3°F | Resp 16 | Ht 70.0 in | Wt 193.2 lb

## 2018-08-10 DIAGNOSIS — J3089 Other allergic rhinitis: Secondary | ICD-10-CM

## 2018-08-10 DIAGNOSIS — K219 Gastro-esophageal reflux disease without esophagitis: Secondary | ICD-10-CM | POA: Diagnosis not present

## 2018-08-10 MED ORDER — PANTOPRAZOLE SODIUM 40 MG PO TBEC: DELAYED_RELEASE_TABLET | ORAL | 1 refills | Status: DC

## 2018-08-10 MED ORDER — AZELASTINE HCL 0.1 % NA SOLN: NASAL | 3 refills | Status: DC

## 2018-08-10 MED ORDER — MONTELUKAST SODIUM 10 MG PO TABS: 10.0000 mg | ORAL_TABLET | Freq: Every day | ORAL | 1 refills | Status: DC

## 2018-08-10 NOTE — Progress Notes (Signed)
[place]   Follow-up Note  Referring Provider: Johny BlamerHarris, William, MD Primary Provider: Johny BlamerHarris, William, MD Date of Office Visit: 08/10/2018  Subjective:   Oscar CharonBennie F Roan Jr. (DOB: 10/14/1945) is a 73 y.o. male who [facility] 08[facility]10[facility]2018 in re-evaluation of the following:  HPI: Oscar Ward returns to this clinic in reevaluation of his allergic rhinitis and LPR.  He has not been seen in this clinic since 08 November 2017.  Overall he has had an excellent interval of time without a requirement for systemic steroid or an antibiotic to treat any type of respiratory tract issue and he is very happy with the response he is receiving while using his current therapy which includes a leukotriene modifier and nasal antihistamines.  He has had no issues with his throat and no issues with reflux while using Protonix on a daily basis.  He was able to discontinue his H2 receptor blocker.  Allergies as of 08/10/2018      Reactions   Marcaine [bupivacaine Hcl] Rash      Medication List      Allegra Allergy 180 MG tablet Generic drug: fexofenadine Take one tablet by mouth once every morning.   atorvastatin 20 MG tablet Commonly known as: LIPITOR Take 20 mg by mouth every evening.   azelastine 0.1 % nasal spray Commonly known as: ASTELIN Use in each nostril as directed   butalbital-acetaminophen-caffeine 50-325-40 MG tablet Commonly known as: FIORICET Take 1 tablet by mouth as needed for headache.   cetirizine 10 MG tablet Commonly known as: ZYRTEC Take one tablet at betime   HYDROcodone-acetaminophen 10-325 MG tablet Commonly known as: NORCO TK 1 T PO Q 4 TO 6 H PRN P   IRON PO Take 325 mg by mouth daily.   losartan 100 MG tablet Commonly known as: COZAAR Take 100 mg by mouth every evening.   montelukast 10 MG tablet Commonly known as: SINGULAIR TAKE 1 TABLET(10 MG) BY MOUTH DAILY   multivitamin with  minerals Tabs tablet Take 1 tablet by mouth daily.   pantoprazole 40 MG tablet Commonly known as: PROTONIX TAKE 1 TABLET BY MOUTH EVERY DAY FOR REFLUX   SENNA S PO Take by mouth.       Past Medical History:  Diagnosis Date  . Arthritis   . Chronic kidney disease    only one kidney since bitth  . Fibromyalgia   . GERD (gastroesophageal reflux disease)   . Headache   . Hypertension   . PONV (postoperative nausea and vomiting)     Past Surgical History:  Procedure Laterality Date  . ADENOIDECTOMY    . APPENDECTOMY    . CERVICAL FUSION    . CHOLECYSTECTOMY    . COLON SURGERY     megacolon decreased   . HEMORRHOID SURGERY  1985  . HERNIA REPAIR     times 2  . JOINT REPLACEMENT Left    knee  . MENISCUS REPAIR Right 2011  . PROSTATE SURGERY  2006  . right testicle      removed  . SHOULDER SURGERY Left    Bone Spur  . SINOSCOPY    . TONSILLECTOMY    . TOTAL KNEE REVISION Left 10/15/2015   Procedure: LEFT TOTAL KNEE REVISION, synovectomy with poly exchange;  Surgeon: Kathryne Hitchhristopher Y Blackman, MD;  Location: [facility];  Service: Orthopedics;  Laterality: Left;    Review of systems negative except as noted in  HPI / PMHx or noted below:  Review of Systems  Constitutional: Negative.   HENT: Negative.   Eyes: Negative.   Respiratory: Negative.   Cardiovascular: Negative.   Gastrointestinal: Negative.   Genitourinary: Negative.   Musculoskeletal: Negative.   Skin: Negative.   Neurological: Negative.   Endo/Heme/Allergies: Negative.   Psychiatric/Behavioral: Negative.      Objective:   Vitals:   08/10/18 1527  BP: 122/84  Pulse: 61  Resp: 16  Temp: 98.3 F (36.8 C)  SpO2: 98%   Height: 5\' 10"  (177.8 cm)  Weight: 193 lb 3.2 oz (87.6 kg)   Physical Exam Constitutional:      Appearance: He is not diaphoretic.  HENT:     Head: Normocephalic.     Right Ear: External ear normal.     Left Ear: External ear normal.     Ears:     Comments: Bilateral hearing  aid    Nose: Nose normal. No mucosal edema or rhinorrhea.     Mouth/Throat:     Pharynx: Uvula midline. No oropharyngeal exudate.  Eyes:     Conjunctiva/sclera: Conjunctivae normal.  Neck:     Thyroid: No thyromegaly.     Trachea: Trachea normal. No tracheal tenderness or tracheal deviation.  Cardiovascular:     Rate and Rhythm: Normal rate and regular rhythm.     Heart sounds: Normal heart sounds, S1 normal and S2 normal. No murmur.  Pulmonary:     Effort: No respiratory distress.     Breath sounds: Normal breath sounds. No stridor. No wheezing or rales.  Lymphadenopathy:     Head:     Right side of head: No tonsillar adenopathy.     Left side of head: No tonsillar adenopathy.     Cervical: No cervical adenopathy.  Skin:    Findings: No erythema or rash.     Nails: There is no clubbing.   Neurological:     Mental Status: He is alert.     Diagnostics: none  Assessment and Plan:   1. Other allergic rhinitis   2. LPRD (laryngopharyngeal reflux disease)     1. Continue montelukast 10mg  daily and Astelin 2 sprays each nostril twice a day  2. Continue Pantoprazole 40 MG daily  3. Continue Allegra 180 in AM and Cetirizine 10mg  in PM if needed  4. Return in 12 months  or earlier if problem  5. Obtain fall flu vaccine (and COVID vaccine)  Oscar Ward is really doing quite well on his current therapy and he has a very good understanding of how his medications work and appropriate dosing of his medications.  We will continue on anti-inflammatory agents for his airway and therapy directed against reflux and I will see him back in this clinic in a year or earlier if there is a problem.  Oscar Katz, MD Allergy / Immunology [place]

## 2018-08-10 NOTE — Patient Instructions (Addendum)
  1. Continue montelukast 10mg  daily and Astelin 2 sprays each nostril twice a day  2. Continue Pantoprazole 40 MG daily  3. Continue Allegra 180 in AM and Cetirizine 10mg  in PM if needed  4. Return in 12 months  or earlier if problem  5. Obtain fall flu vaccine (and COVID vaccine)

## 2018-08-11 ENCOUNTER — Encounter: Payer: Self-pay | Admitting: Allergy and Immunology

## 2018-11-18 ENCOUNTER — Other Ambulatory Visit: Payer: Self-pay | Admitting: Allergy and Immunology

## 2019-02-19 ENCOUNTER — Other Ambulatory Visit: Payer: Self-pay | Admitting: Allergy and Immunology

## 2019-03-05 ENCOUNTER — Other Ambulatory Visit: Payer: Self-pay | Admitting: Allergy and Immunology

## 2019-05-28 DIAGNOSIS — J0100 Acute maxillary sinusitis, unspecified: Secondary | ICD-10-CM | POA: Diagnosis not present

## 2019-05-29 ENCOUNTER — Other Ambulatory Visit: Payer: Self-pay | Admitting: Allergy and Immunology

## 2019-06-04 ENCOUNTER — Other Ambulatory Visit: Payer: Self-pay | Admitting: Allergy and Immunology

## 2019-06-06 DIAGNOSIS — L570 Actinic keratosis: Secondary | ICD-10-CM | POA: Diagnosis not present

## 2019-06-06 DIAGNOSIS — L578 Other skin changes due to chronic exposure to nonionizing radiation: Secondary | ICD-10-CM | POA: Diagnosis not present

## 2019-06-06 DIAGNOSIS — L821 Other seborrheic keratosis: Secondary | ICD-10-CM | POA: Diagnosis not present

## 2019-06-06 DIAGNOSIS — L720 Epidermal cyst: Secondary | ICD-10-CM | POA: Diagnosis not present

## 2019-06-08 ENCOUNTER — Other Ambulatory Visit: Payer: Self-pay

## 2019-06-08 ENCOUNTER — Ambulatory Visit (INDEPENDENT_AMBULATORY_CARE_PROVIDER_SITE_OTHER): Payer: Medicare PPO | Admitting: Allergy and Immunology

## 2019-06-08 ENCOUNTER — Encounter: Payer: Self-pay | Admitting: Allergy and Immunology

## 2019-06-08 VITALS — BP 122/78 | HR 68 | Temp 97.7°F | Resp 16 | Ht 71.0 in | Wt 194.0 lb

## 2019-06-08 DIAGNOSIS — J3089 Other allergic rhinitis: Secondary | ICD-10-CM

## 2019-06-08 DIAGNOSIS — J0140 Acute pansinusitis, unspecified: Secondary | ICD-10-CM | POA: Diagnosis not present

## 2019-06-08 DIAGNOSIS — K219 Gastro-esophageal reflux disease without esophagitis: Secondary | ICD-10-CM

## 2019-06-08 NOTE — Progress Notes (Signed)
[facility] - [place] - [place] - [place] - [place]   Follow-up Note  Referring Provider: Johny Blamer, MD Primary Provider: Johny Blamer, MD Date of Office Visit: 06/08/2019  Subjective:   Oscar Ward. (DOB: [birth year]) is a 74 y.o. male who [facility] in re-evaluation of the following:  HPI: Oscar Ward returns to this clinic in reevaluation of allergic rhinitis and LPR.  His last visit with me in this clinic was 10 August 2018.  He was doing wonderful with his allergic rhinitis and LPR for a prolonged period in time while consistently using montelukast and pantoprazole.  He did not require systemic steroid or antibiotic for any type of airway issue.  Unfortunately, the last week of April [place] was at the beach and he developed acute onset of headache and nasal congestion and nasal pressure and within 3 days went to the urgent care center and was treated with Augmentin for 10 days and a systemic steroid injection.  He has had much less congestion of his head and his headache has resolved but he still has a little bit of nasal pressure and some runny nose but the thing that bothers him the most is that he is still coughing.  He will go through intermittent spells of cough.  And he cannot really smell that well.  He has not had any fever or ugly nasal discharge.  He has received 2 Pfizer Covid vaccinations earlier this year.   Allergies as of 06/08/2019      Reactions   Marcaine [bupivacaine Hcl] Rash      Medication List    atorvastatin 20 MG tablet Commonly known as: LIPITOR Take 20 mg by mouth every evening.   azelastine 0.1 % nasal spray Commonly known as: ASTELIN USE IN EACH NOSTRIL AS DIRECTED   butalbital-acetaminophen-caffeine 50-325-40 MG tablet Commonly known as: FIORICET Take 1 tablet by mouth as needed for headache.   cetirizine 10 MG tablet Commonly known as: ZYRTEC TAKE 1 TABLET BY MOUTH AT BEDTIME     HYDROcodone-acetaminophen 10-325 MG tablet Commonly known as: NORCO TK 1 T PO Q 4 TO 6 H PRN P   IRON PO Take 325 mg by mouth daily.   losartan 100 MG tablet Commonly known as: COZAAR Take 100 mg by mouth every evening.   montelukast 10 MG tablet Commonly known as: SINGULAIR TAKE 1 TABLET BY MOUTH AT BEDTIME   multivitamin with minerals Tabs tablet Take 1 tablet by mouth daily.   pantoprazole 40 MG tablet Commonly known as: PROTONIX TAKE 1 TABLET BY MOUTH ONCE DAILY       Past Medical History:  Diagnosis Date  . Arthritis   . Chronic kidney disease    only one kidney since bitth  . Fibromyalgia   . GERD (gastroesophageal reflux disease)   . Headache   . Hypertension   . PONV (postoperative nausea and vomiting)     Past Surgical History:  Procedure Laterality Date  . ADENOIDECTOMY    . APPENDECTOMY    . CERVICAL FUSION    . CHOLECYSTECTOMY    . COLON SURGERY     megacolon decreased   . HEMORRHOID SURGERY  1985  . HERNIA REPAIR     times 2  . JOINT REPLACEMENT Left    knee  . MENISCUS REPAIR Right 2011  . PROSTATE SURGERY  2006  . right testicle      removed  . SHOULDER SURGERY Left  Bone Spur  . SINOSCOPY    . TONSILLECTOMY    . TOTAL KNEE REVISION Left 10/15/2015   Procedure: LEFT TOTAL KNEE REVISION, synovectomy with poly exchange;  Surgeon: Kathryne Hitch, MD;  Location: [facility] OR;  Service: Orthopedics;  Laterality: Left;    Review of systems negative except as noted in HPI / PMHx or noted below:  Review of Systems  Constitutional: Negative.   HENT: Negative.   Eyes: Negative.   Respiratory: Negative.   Cardiovascular: Negative.   Gastrointestinal: Negative.   Genitourinary: Negative.   Musculoskeletal: Negative.   Skin: Negative.   Neurological: Negative.   Endo/Heme/Allergies: Negative.   Psychiatric/Behavioral: Negative.      Objective:   Vitals:   06/08/19 1115  BP: 122/78  Pulse: 68  Resp: 16  Temp: 97.7 F (36.5  C)  SpO2: 97%   Height: 5\' 11"  (180.3 cm)  Weight: 194 lb (88 kg)   Physical Exam Constitutional:      Appearance: He is not diaphoretic.  HENT:     Head: Normocephalic.     Right Ear: Tympanic membrane, ear canal and external ear normal.     Left Ear: Tympanic membrane, ear canal and external ear normal.     Nose: Nose normal. No mucosal edema or rhinorrhea.     Mouth/Throat:     Pharynx: Uvula midline. No oropharyngeal exudate.  Eyes:     Conjunctiva/sclera: Conjunctivae normal.  Neck:     Thyroid: No thyromegaly.     Trachea: Trachea normal. No tracheal tenderness or tracheal deviation.  Cardiovascular:     Rate and Rhythm: Normal rate and regular rhythm.     Heart sounds: Normal heart sounds, S1 normal and S2 normal. No murmur.  Pulmonary:     Effort: No respiratory distress.     Breath sounds: Normal breath sounds. No stridor. No wheezing or rales.  Lymphadenopathy:     Head:     Right side of head: No tonsillar adenopathy.     Left side of head: No tonsillar adenopathy.     Cervical: No cervical adenopathy.  Skin:    Findings: No erythema or rash.     Nails: There is no clubbing.  Neurological:     Mental Status: He is alert.     Diagnostics: none  Assessment and Plan:   1. Other allergic rhinitis   2. LPRD (laryngopharyngeal reflux disease)   3. Acute non-recurrent pansinusitis     1. Continue montelukast 10mg  daily and Astelin 2 sprays each nostril twice a day  2. Continue Pantoprazole 40 MG daily  3. Cetirizine 10 mg - 1-2 tablets 1-2 times per day  4. Can add Mucinex DM - 1-2 tablets 1-2 times per day  5. Further treatment?  6. Return in 12 months  or earlier if problem  appears to have developed a respiratory tract infection of viral etiology and he is now dealing with the sequela of that infection.  Overall it sounds as though he has done better over the course of the past 3 weeks but he still has some inability to smell and some runny  nose and occasional coughing spells.  We will assume that with time and with the therapy noted above he will resolve all of this issue.  Certainly if he cannot reobtain his ability to smell or if he continues with coughing spells as we move forward then we will need to approach this issue a little differently.  He will keep in  contact with me noting his response to this plan.  Allena Katz, MD [facility]

## 2019-06-08 NOTE — Patient Instructions (Addendum)
  1. Continue montelukast 10mg  daily and Astelin 2 sprays each nostril twice a day  2. Continue Pantoprazole 40 MG daily  3. Cetirizine 10 mg - 1-2 tablets 1-2 times per day  4. Can add Mucinex DM - 1-2 tablets 1-2 times per day  5. Further treatment?  6. Return in 12 months  or earlier if problem

## 2019-06-09 DIAGNOSIS — M545 Low back pain: Secondary | ICD-10-CM | POA: Diagnosis not present

## 2019-06-09 DIAGNOSIS — G894 Chronic pain syndrome: Secondary | ICD-10-CM | POA: Diagnosis not present

## 2019-06-09 DIAGNOSIS — M47896 Other spondylosis, lumbar region: Secondary | ICD-10-CM | POA: Diagnosis not present

## 2019-06-12 ENCOUNTER — Encounter: Payer: Self-pay | Admitting: Allergy and Immunology

## 2019-06-28 DIAGNOSIS — Z79899 Other long term (current) drug therapy: Secondary | ICD-10-CM | POA: Diagnosis not present

## 2019-07-10 DIAGNOSIS — M47896 Other spondylosis, lumbar region: Secondary | ICD-10-CM | POA: Diagnosis not present

## 2019-07-10 DIAGNOSIS — M545 Low back pain: Secondary | ICD-10-CM | POA: Diagnosis not present

## 2019-07-10 DIAGNOSIS — G894 Chronic pain syndrome: Secondary | ICD-10-CM | POA: Diagnosis not present

## 2019-07-13 DIAGNOSIS — G894 Chronic pain syndrome: Secondary | ICD-10-CM | POA: Diagnosis not present

## 2019-07-13 DIAGNOSIS — Z79891 Long term (current) use of opiate analgesic: Secondary | ICD-10-CM | POA: Diagnosis not present

## 2019-07-13 DIAGNOSIS — Z79899 Other long term (current) drug therapy: Secondary | ICD-10-CM | POA: Diagnosis not present

## 2019-08-09 DIAGNOSIS — G894 Chronic pain syndrome: Secondary | ICD-10-CM | POA: Diagnosis not present

## 2019-08-09 DIAGNOSIS — M545 Low back pain: Secondary | ICD-10-CM | POA: Diagnosis not present

## 2019-08-09 DIAGNOSIS — M47896 Other spondylosis, lumbar region: Secondary | ICD-10-CM | POA: Diagnosis not present

## 2019-08-10 ENCOUNTER — Ambulatory Visit: Payer: Medicare Other | Admitting: Allergy and Immunology

## 2019-08-15 ENCOUNTER — Other Ambulatory Visit: Payer: Self-pay | Admitting: Allergy and Immunology

## 2019-08-20 ENCOUNTER — Other Ambulatory Visit: Payer: Self-pay | Admitting: Allergy and Immunology

## 2019-08-31 ENCOUNTER — Other Ambulatory Visit: Payer: Self-pay | Admitting: Allergy and Immunology

## 2019-09-07 DIAGNOSIS — G894 Chronic pain syndrome: Secondary | ICD-10-CM | POA: Diagnosis not present

## 2019-09-07 DIAGNOSIS — M47896 Other spondylosis, lumbar region: Secondary | ICD-10-CM | POA: Diagnosis not present

## 2019-09-07 DIAGNOSIS — M545 Low back pain: Secondary | ICD-10-CM | POA: Diagnosis not present

## 2019-10-06 DIAGNOSIS — G894 Chronic pain syndrome: Secondary | ICD-10-CM | POA: Diagnosis not present

## 2019-10-06 DIAGNOSIS — M545 Low back pain: Secondary | ICD-10-CM | POA: Diagnosis not present

## 2019-10-06 DIAGNOSIS — M47896 Other spondylosis, lumbar region: Secondary | ICD-10-CM | POA: Diagnosis not present

## 2019-11-07 DIAGNOSIS — G894 Chronic pain syndrome: Secondary | ICD-10-CM | POA: Diagnosis not present

## 2019-11-07 DIAGNOSIS — M47896 Other spondylosis, lumbar region: Secondary | ICD-10-CM | POA: Diagnosis not present

## 2019-11-17 ENCOUNTER — Other Ambulatory Visit: Payer: Self-pay | Admitting: Allergy and Immunology

## 2019-11-25 ENCOUNTER — Other Ambulatory Visit: Payer: Self-pay | Admitting: Allergy and Immunology

## 2019-11-29 DIAGNOSIS — Z20822 Contact with and (suspected) exposure to covid-19: Secondary | ICD-10-CM | POA: Diagnosis not present

## 2019-12-06 DIAGNOSIS — M47896 Other spondylosis, lumbar region: Secondary | ICD-10-CM | POA: Diagnosis not present

## 2019-12-06 DIAGNOSIS — G894 Chronic pain syndrome: Secondary | ICD-10-CM | POA: Diagnosis not present

## 2019-12-07 DIAGNOSIS — L578 Other skin changes due to chronic exposure to nonionizing radiation: Secondary | ICD-10-CM | POA: Diagnosis not present

## 2019-12-07 DIAGNOSIS — L821 Other seborrheic keratosis: Secondary | ICD-10-CM | POA: Diagnosis not present

## 2019-12-07 DIAGNOSIS — L570 Actinic keratosis: Secondary | ICD-10-CM | POA: Diagnosis not present

## 2019-12-07 DIAGNOSIS — C44311 Basal cell carcinoma of skin of nose: Secondary | ICD-10-CM | POA: Diagnosis not present

## 2020-01-03 DIAGNOSIS — M47896 Other spondylosis, lumbar region: Secondary | ICD-10-CM | POA: Diagnosis not present

## 2020-01-03 DIAGNOSIS — G894 Chronic pain syndrome: Secondary | ICD-10-CM | POA: Diagnosis not present

## 2020-02-05 DIAGNOSIS — M47896 Other spondylosis, lumbar region: Secondary | ICD-10-CM | POA: Diagnosis not present

## 2020-02-05 DIAGNOSIS — M542 Cervicalgia: Secondary | ICD-10-CM | POA: Diagnosis not present

## 2020-02-05 DIAGNOSIS — Z79891 Long term (current) use of opiate analgesic: Secondary | ICD-10-CM | POA: Diagnosis not present

## 2020-02-05 DIAGNOSIS — Z79899 Other long term (current) drug therapy: Secondary | ICD-10-CM | POA: Diagnosis not present

## 2020-02-05 DIAGNOSIS — G894 Chronic pain syndrome: Secondary | ICD-10-CM | POA: Diagnosis not present

## 2020-03-04 DIAGNOSIS — G894 Chronic pain syndrome: Secondary | ICD-10-CM | POA: Diagnosis not present

## 2020-03-04 DIAGNOSIS — M47896 Other spondylosis, lumbar region: Secondary | ICD-10-CM | POA: Diagnosis not present

## 2020-03-15 ENCOUNTER — Other Ambulatory Visit: Payer: Self-pay | Admitting: Allergy and Immunology

## 2020-04-03 DIAGNOSIS — M47896 Other spondylosis, lumbar region: Secondary | ICD-10-CM | POA: Diagnosis not present

## 2020-04-03 DIAGNOSIS — G894 Chronic pain syndrome: Secondary | ICD-10-CM | POA: Diagnosis not present

## 2020-05-03 DIAGNOSIS — M47896 Other spondylosis, lumbar region: Secondary | ICD-10-CM | POA: Diagnosis not present

## 2020-05-03 DIAGNOSIS — G894 Chronic pain syndrome: Secondary | ICD-10-CM | POA: Diagnosis not present

## 2020-05-13 DIAGNOSIS — R519 Headache, unspecified: Secondary | ICD-10-CM | POA: Diagnosis not present

## 2020-05-13 DIAGNOSIS — E7800 Pure hypercholesterolemia, unspecified: Secondary | ICD-10-CM | POA: Diagnosis not present

## 2020-05-13 DIAGNOSIS — I10 Essential (primary) hypertension: Secondary | ICD-10-CM | POA: Diagnosis not present

## 2020-05-13 DIAGNOSIS — G8929 Other chronic pain: Secondary | ICD-10-CM | POA: Diagnosis not present

## 2020-05-13 DIAGNOSIS — E559 Vitamin D deficiency, unspecified: Secondary | ICD-10-CM | POA: Diagnosis not present

## 2020-05-13 DIAGNOSIS — Z0000 Encounter for general adult medical examination without abnormal findings: Secondary | ICD-10-CM | POA: Diagnosis not present

## 2020-05-13 DIAGNOSIS — R7303 Prediabetes: Secondary | ICD-10-CM | POA: Diagnosis not present

## 2020-05-20 ENCOUNTER — Other Ambulatory Visit: Payer: Self-pay | Admitting: Allergy and Immunology

## 2020-05-31 DIAGNOSIS — G894 Chronic pain syndrome: Secondary | ICD-10-CM | POA: Diagnosis not present

## 2020-05-31 DIAGNOSIS — M47896 Other spondylosis, lumbar region: Secondary | ICD-10-CM | POA: Diagnosis not present

## 2020-06-01 DIAGNOSIS — J0181 Other acute recurrent sinusitis: Secondary | ICD-10-CM | POA: Diagnosis not present

## 2020-06-01 DIAGNOSIS — Z1159 Encounter for screening for other viral diseases: Secondary | ICD-10-CM | POA: Diagnosis not present

## 2020-06-06 ENCOUNTER — Other Ambulatory Visit: Payer: Self-pay

## 2020-06-06 ENCOUNTER — Encounter: Payer: Self-pay | Admitting: Allergy and Immunology

## 2020-06-06 ENCOUNTER — Other Ambulatory Visit: Payer: Self-pay | Admitting: Allergy and Immunology

## 2020-06-06 ENCOUNTER — Ambulatory Visit: Payer: Medicare Other | Admitting: Allergy and Immunology

## 2020-06-06 VITALS — BP 124/82 | HR 60 | Resp 16 | Ht 71.0 in | Wt 196.0 lb

## 2020-06-06 DIAGNOSIS — L821 Other seborrheic keratosis: Secondary | ICD-10-CM | POA: Diagnosis not present

## 2020-06-06 DIAGNOSIS — L570 Actinic keratosis: Secondary | ICD-10-CM | POA: Diagnosis not present

## 2020-06-06 DIAGNOSIS — K219 Gastro-esophageal reflux disease without esophagitis: Secondary | ICD-10-CM

## 2020-06-06 DIAGNOSIS — J3089 Other allergic rhinitis: Secondary | ICD-10-CM | POA: Diagnosis not present

## 2020-06-06 DIAGNOSIS — L578 Other skin changes due to chronic exposure to nonionizing radiation: Secondary | ICD-10-CM | POA: Diagnosis not present

## 2020-06-06 MED ORDER — FLUTICASONE PROPIONATE 50 MCG/ACT NA SUSP
NASAL | 11 refills | Status: DC
Start: 2020-06-06 — End: 2020-06-11

## 2020-06-06 MED ORDER — MONTELUKAST SODIUM 10 MG PO TABS: 1.0000 | ORAL_TABLET | Freq: Every day | ORAL | 3 refills | Status: DC

## 2020-06-06 NOTE — Progress Notes (Signed)
[place] - [place] - [place] - [place] - [place]   Follow-up Note  Referring Provider: Johny Blamer, MD Primary Provider: Johny Blamer, MD Date of Office Visit: 06/06/2020  Subjective:   Oscar Ward. (DOB: 18-Nov-1945) is a 75 y.o. male who [facility]2020 in re-evaluation of the following:  HPI: Oscar Ward returns to this clinic in evaluation of allergic rhinitis and LPR.  His last visit to this clinic was 08 Jun 2019.  He has really done well with his upper airway, not requiring a systemic steroid or antibiotic for any type of airway issue, and feeling as though he had very good control over his upper airway issue while using montelukast and nasal azelastine.  Unfortunately, 5 days ago he developed acute onset of head fullness and nasal burning and he went to the urgent care center within 24 hours and received a Kenalog injection and Augmentin and he feels fine at this point.  As well, his insurance company will no longer pay for azelastine at a reasonable price.  He would like to try fluticasone.  His reflux has been under excellent control at this point in time while using his proton pump inhibitor.  He has had 4 Pfizer COVID vaccines.  Allergies as of 06/06/2020      Reactions   Marcaine [bupivacaine Hcl] Rash      Medication List    atorvastatin 20 MG tablet Commonly known as: LIPITOR Take 20 mg by mouth every evening.   butalbital-acetaminophen-caffeine 50-325-40 MG tablet Commonly known as: FIORICET Take 1 tablet by mouth as needed for headache.   cetirizine 10 MG tablet Commonly known as: ZYRTEC TAKE 1 TABLET BY MOUTH AT BEDTIME   HYDROcodone-acetaminophen 10-325 MG tablet Commonly known as: NORCO TK 1 T PO Q 4 TO 6 H PRN P   IRON PO Take 325 mg by mouth daily.   losartan 100 MG tablet Commonly known as: COZAAR Take 100 mg by mouth every evening.   montelukast 10 MG tablet Commonly known as:  SINGULAIR TAKE 1 TABLET BY MOUTH AT BEDTIME   multivitamin with minerals Tabs tablet Take 1 tablet by mouth daily.   pantoprazole 40 MG tablet Commonly known as: PROTONIX TAKE 1 TABLET BY MOUTH ONCE DAILY       Past Medical History:  Diagnosis Date  . Arthritis   . Chronic kidney disease    only one kidney since bitth  . Fibromyalgia   . GERD (gastroesophageal reflux disease)   . Headache   . Hypertension   . PONV (postoperative nausea and vomiting)     Past Surgical History:  Procedure Laterality Date  . ADENOIDECTOMY    . APPENDECTOMY    . CERVICAL FUSION    . CHOLECYSTECTOMY    . COLON SURGERY     megacolon decreased   . HEMORRHOID SURGERY  1985  . HERNIA REPAIR     times 2  . JOINT REPLACEMENT Left    knee  . MENISCUS REPAIR Right 2011  . PROSTATE SURGERY  2006  . right testicle      removed  . SHOULDER SURGERY Left    Bone Spur  . SINOSCOPY    . TONSILLECTOMY    . TOTAL KNEE REVISION Left 10/15/2015   Procedure: LEFT TOTAL KNEE REVISION, synovectomy with poly exchange;  Surgeon: Kathryne Hitch, MD;  Location: [facility];  Service: Orthopedics;  Laterality: Left;    Review of systems negative except  as noted in HPI / PMHx or noted below:  Review of Systems  Constitutional: Negative.   HENT: Negative.   Eyes: Negative.   Respiratory: Negative.   Cardiovascular: Negative.   Gastrointestinal: Negative.   Genitourinary: Negative.   Musculoskeletal: Negative.   Skin: Negative.   Neurological: Negative.   Endo/Heme/Allergies: Negative.   Psychiatric/Behavioral: Negative.      Objective:   Vitals:   06/06/20 1342  BP: 124/82  Pulse: 60  Resp: 16  SpO2: 97%   Height: 5\' 11"  (180.3 cm)  Weight: 196 lb (88.9 kg)   Physical Exam Constitutional:      Appearance: He is not diaphoretic.  HENT:     Head: Normocephalic.     Right Ear: Tympanic membrane, ear canal and external ear normal.     Left Ear: Tympanic membrane, ear canal and  external ear normal.     Nose: Nose normal. No mucosal edema or rhinorrhea.     Mouth/Throat:     Pharynx: Uvula midline. No oropharyngeal exudate.  Eyes:     Conjunctiva/sclera: Conjunctivae normal.  Neck:     Thyroid: No thyromegaly.     Trachea: Trachea normal. No tracheal tenderness or tracheal deviation.  Cardiovascular:     Rate and Rhythm: Normal rate and regular rhythm.     Heart sounds: Normal heart sounds, S1 normal and S2 normal. No murmur heard.   Pulmonary:     Effort: No respiratory distress.     Breath sounds: Normal breath sounds. No stridor. No wheezing or rales.  Lymphadenopathy:     Head:     Right side of head: No tonsillar adenopathy.     Left side of head: No tonsillar adenopathy.     Cervical: No cervical adenopathy.  Skin:    Findings: No erythema or rash.     Nails: There is no clubbing.  Neurological:     Mental Status: He is alert.     Diagnostics: none  Assessment and Plan:   1. Other allergic rhinitis   2. LPRD (laryngopharyngeal reflux disease)     1. Continue montelukast 10mg  daily   2. Start Flonase - 1-2 sprays each nostril 3-7 times per week. Takes days to work.  2. Continue Pantoprazole 40 MG daily  3. Cetirizine 10 mg - 1-2 tablets 1-2 times per day  4. Return in 12 months or earlier if problem  appears to be doing quite well on his current plan.  His azelastine does not appear to be financially available to him at this point and we will give him some Flonase.  He will continue on a leukotriene modifier to address his upper airway inflammation as well as using Flonase.  He will continue on a proton pump inhibitor to address his reflux disease.  I will see him back in his clinic in 1 year or earlier if there is a problem.  , MD Allergy / Immunology [facility]

## 2020-06-06 NOTE — Patient Instructions (Signed)
  1. Continue montelukast 10mg  daily   2. Start Flonase - 1-2 sprays each nostril 3-7 times per week. Takes days to work.  2. Continue Pantoprazole 40 MG daily  3. Cetirizine 10 mg - 1-2 tablets 1-2 times per day  4. Return in 12 months  or earlier if problem

## 2020-06-07 ENCOUNTER — Encounter: Payer: Self-pay | Admitting: Allergy and Immunology

## 2020-06-11 ENCOUNTER — Telehealth: Payer: Self-pay | Admitting: Allergy and Immunology

## 2020-06-11 ENCOUNTER — Other Ambulatory Visit: Payer: Self-pay

## 2020-06-11 MED ORDER — FLUTICASONE PROPIONATE 50 MCG/ACT NA SUSP: NASAL | 11 refills | Status: DC

## 2020-06-11 MED ORDER — PANTOPRAZOLE SODIUM 40 MG PO TBEC
1.0000 | DELAYED_RELEASE_TABLET | Freq: Every day | ORAL | 3 refills | Status: DC
Start: 2020-06-11 — End: 2021-06-19

## 2020-06-11 MED ORDER — MONTELUKAST SODIUM 10 MG PO TABS: 1.0000 | ORAL_TABLET | Freq: Every day | ORAL | 3 refills | Status: DC

## 2020-06-11 MED ORDER — CETIRIZINE HCL 10 MG PO TABS: 10.0000 mg | ORAL_TABLET | Freq: Every day | ORAL | 3 refills | Status: DC

## 2020-06-11 NOTE — Telephone Encounter (Signed)
Prescriptions for Pantoprazole, montelukast, cetirizine and flonase  was sent in to [facility] [address] Patient was informed

## 2020-06-11 NOTE — Telephone Encounter (Signed)
Patient called and needs to have [facility] [address] The pantoprazole 40mg . And montelukast 10mg . And cetirizine 10 mg. And flonase. If you have any question call him at [phone]. He said he will not use [facility] anymore.

## 2020-07-04 DIAGNOSIS — M47896 Other spondylosis, lumbar region: Secondary | ICD-10-CM | POA: Diagnosis not present

## 2020-07-04 DIAGNOSIS — G894 Chronic pain syndrome: Secondary | ICD-10-CM | POA: Diagnosis not present

## 2020-08-02 DIAGNOSIS — G894 Chronic pain syndrome: Secondary | ICD-10-CM | POA: Diagnosis not present

## 2020-08-02 DIAGNOSIS — M47896 Other spondylosis, lumbar region: Secondary | ICD-10-CM | POA: Diagnosis not present

## 2020-08-29 DIAGNOSIS — M47896 Other spondylosis, lumbar region: Secondary | ICD-10-CM | POA: Diagnosis not present

## 2020-08-29 DIAGNOSIS — G894 Chronic pain syndrome: Secondary | ICD-10-CM | POA: Diagnosis not present

## 2020-09-02 DIAGNOSIS — Z79899 Other long term (current) drug therapy: Secondary | ICD-10-CM | POA: Diagnosis not present

## 2020-09-02 DIAGNOSIS — G894 Chronic pain syndrome: Secondary | ICD-10-CM | POA: Diagnosis not present

## 2020-09-02 DIAGNOSIS — Z79891 Long term (current) use of opiate analgesic: Secondary | ICD-10-CM | POA: Diagnosis not present

## 2020-09-23 DIAGNOSIS — J0110 Acute frontal sinusitis, unspecified: Secondary | ICD-10-CM | POA: Diagnosis not present

## 2020-10-01 DIAGNOSIS — M47896 Other spondylosis, lumbar region: Secondary | ICD-10-CM | POA: Diagnosis not present

## 2020-10-01 DIAGNOSIS — G894 Chronic pain syndrome: Secondary | ICD-10-CM | POA: Diagnosis not present

## 2020-10-30 DIAGNOSIS — M47896 Other spondylosis, lumbar region: Secondary | ICD-10-CM | POA: Diagnosis not present

## 2020-10-30 DIAGNOSIS — G894 Chronic pain syndrome: Secondary | ICD-10-CM | POA: Diagnosis not present

## 2020-11-21 ENCOUNTER — Other Ambulatory Visit: Payer: Self-pay

## 2020-11-21 ENCOUNTER — Ambulatory Visit: Payer: Medicare Other | Admitting: Allergy and Immunology

## 2020-11-21 ENCOUNTER — Encounter: Payer: Self-pay | Admitting: Allergy and Immunology

## 2020-11-21 VITALS — BP 118/72 | HR 61 | Resp 16

## 2020-11-21 DIAGNOSIS — K219 Gastro-esophageal reflux disease without esophagitis: Secondary | ICD-10-CM

## 2020-11-21 DIAGNOSIS — J310 Chronic rhinitis: Secondary | ICD-10-CM | POA: Diagnosis not present

## 2020-11-21 DIAGNOSIS — J3089 Other allergic rhinitis: Secondary | ICD-10-CM | POA: Diagnosis not present

## 2020-11-21 DIAGNOSIS — T485X5A Adverse effect of other anti-common-cold drugs, initial encounter: Secondary | ICD-10-CM

## 2020-11-21 MED ORDER — METHYLPREDNISOLONE ACETATE 80 MG/ML IJ SUSP
80.0000 mg | Freq: Once | INTRAMUSCULAR | Status: AC
  Administered 2020-11-21: 80 mg via INTRAMUSCULAR

## 2020-11-21 MED ORDER — TRIAMCINOLONE ACETONIDE 55 MCG/ACT NA AERO: 1.0000 | INHALATION_SPRAY | Freq: Two times a day (BID) | NASAL | 5 refills | Status: DC

## 2020-11-21 MED ORDER — AZELASTINE HCL 0.1 % NA SOLN: 1.0000 | Freq: Two times a day (BID) | NASAL | 5 refills | Status: AC

## 2020-11-21 NOTE — Progress Notes (Signed)
[place] - [place] - [place] - [place] - [place]   Follow-up Note  Referring Provider: Johny Blamer, MD Primary Provider: Johny Blamer, MD Date of Office Visit: 11/21/2020  Subjective:   Oscar Charon. (DOB: April 22, 1945) is a 75 y.o. male who [facility] 11[facility]21/2020 in re-evaluation of the following:  HPI: Oscar Ward returns to this clinic in evaluation of allergic rhinitis and LPR.  He was last seen in this clinic on 06 Jun 2020.  Something changed with his upper airways about 4 weeks ago.  He was doing relatively well but developed stuffiness and nasal congestion and postnasal drip and lots of sneezing.  He discontinued his Flonase because it was burning his nose.  He started over-the-counter azelastine and over-the-counter Afrin which he has been using 3 times per day for the past month.  He does not have any anosmia or ugly nasal discharge or inability to smell or headaches or fever.  He also increased his Zyrtec to 3 times per day and then became very sleepy and he discontinued this agent.  His reflux is under very good control at this point in time on his current plan.  He would like to restart his immunotherapy which he utilized over a decade ago.  Allergies as of 11/21/2020       Reactions   Marcaine [bupivacaine Hcl] Rash        Medication List    atorvastatin 20 MG tablet Commonly known as: LIPITOR Take 20 mg by mouth every evening.   butalbital-acetaminophen-caffeine 50-325-40 MG tablet Commonly known as: FIORICET Take 1 tablet by mouth as needed for headache.   cetirizine 10 MG tablet Commonly known as: ZYRTEC Take 1 tablet (10 mg total) by mouth at bedtime.   fluticasone 50 MCG/ACT nasal spray Commonly known as: FLONASE 1-2 sprays each nostril 3-7 times per week   HYDROcodone-acetaminophen 10-325 MG tablet Commonly known as: NORCO TK 1 T PO Q 4 TO 6 H PRN P   IRON PO Take 325 mg by mouth daily.    losartan 100 MG tablet Commonly known as: COZAAR Take 100 mg by mouth every evening.   montelukast 10 MG tablet Commonly known as: SINGULAIR Take 1 tablet (10 mg total) by mouth at bedtime.   multivitamin with minerals Tabs tablet Take 1 tablet by mouth daily.   pantoprazole 40 MG tablet Commonly known as: PROTONIX Take 1 tablet (40 mg total) by mouth daily.     Past Medical History:  Diagnosis Date   Arthritis    Chronic kidney disease    only one kidney since bitth   Fibromyalgia    GERD (gastroesophageal reflux disease)    Headache    Hypertension    PONV (postoperative nausea and vomiting)     Past Surgical History:  Procedure Laterality Date   ADENOIDECTOMY     APPENDECTOMY     CERVICAL FUSION     CHOLECYSTECTOMY     COLON SURGERY     megacolon decreased    HEMORRHOID SURGERY  1985   HERNIA REPAIR     times 2   JOINT REPLACEMENT Left    knee   MENISCUS REPAIR Right 2011   PROSTATE SURGERY  2006   right testicle      removed   SHOULDER SURGERY Left    Bone Spur   SINOSCOPY     TONSILLECTOMY     TOTAL KNEE REVISION Left 10/15/2015   Procedure: LEFT TOTAL  KNEE REVISION, synovectomy with poly exchange;  Surgeon: Kathryne Hitch, MD;  Location: [facility] OR;  Service: Orthopedics;  Laterality: Left;    Review of systems negative except as noted in HPI / PMHx or noted below:  Review of Systems  Constitutional: Negative.   HENT: Negative.    Eyes: Negative.   Respiratory: Negative.    Cardiovascular: Negative.   Gastrointestinal: Negative.   Genitourinary: Negative.   Musculoskeletal: Negative.   Skin: Negative.   Neurological: Negative.   Endo/Heme/Allergies: Negative.   Psychiatric/Behavioral: Negative.      Objective:   Vitals:   11/21/20 1053  BP: 118/72  Pulse: 61  Resp: 16  SpO2: 97%          Physical Exam Constitutional:      Appearance: He is not diaphoretic.  HENT:     Head: Normocephalic.     Right Ear: Tympanic  membrane, ear canal and external ear normal.     Left Ear: Tympanic membrane, ear canal and external ear normal.     Nose: Nose normal. No mucosal edema or rhinorrhea.     Mouth/Throat:     Pharynx: Uvula midline. No oropharyngeal exudate.  Eyes:     Conjunctiva/sclera: Conjunctivae normal.  Neck:     Thyroid: No thyromegaly.     Trachea: Trachea normal. No tracheal tenderness or tracheal deviation.  Cardiovascular:     Rate and Rhythm: Normal rate and regular rhythm.     Heart sounds: Normal heart sounds, S1 normal and S2 normal. No murmur heard. Pulmonary:     Effort: No respiratory distress.     Breath sounds: Normal breath sounds. No stridor. No wheezing or rales.  Lymphadenopathy:     Head:     Right side of head: No tonsillar adenopathy.     Left side of head: No tonsillar adenopathy.     Cervical: No cervical adenopathy.  Skin:    Findings: No erythema or rash.     Nails: There is no clubbing.  Neurological:     Mental Status: He is alert.    Diagnostics: none  Assessment and Plan:   1. Other allergic rhinitis   2. Rhinitis medicamentosa   3. LPRD (laryngopharyngeal reflux disease)    1. Continue montelukast 10mg  daily   2.  Discontinue Afrin nasal spray   3.  Use the following every day:   A. Nasal saline 2 times per day  B. Azelastine - 1 spray each nostril 2 times per day  C. Nasacort - 1 spray each nostril 2 times per day  4.  Depo-Medrol 80 IM delivered in clinic today  5.  Continue Pantoprazole 40 MG daily  6. If needed:  A. Allergra 180 - 1 tablet 1 time per day  7. Immunotherapy??? Will require skin tests without antihistamine  8. Return to clinic in 4 weeks or earlier if needed  will use the plan noted above to address his inflammation and also his rhinitis medicamentosa.  He is interested in undergoing a course of immunotherapy for this issue but I would like for him to first clear up this issue and then we can discuss possibly going on  immunotherapy.  I will see him back in this clinic in 4 weeks or earlier if there is a problem.  He will remain on therapy for his LPR as noted above.  Oscar Apple, MD Allergy / Immunology [facility]

## 2020-11-21 NOTE — Patient Instructions (Signed)
  1. Continue montelukast 10mg  daily   2.  Discontinue Afrin nasal spray   3.  Use the following every day:   A. Nasal saline 2 times per day  B. Azelastine - 1 spray each nostril 2 times per day  C. Nasacort - 1 spray each nostril 2 times per day  4.  Depo-Medrol 80 IM delivered in clinic today  5.  Continue Pantoprazole 40 MG daily  6. If needed:  A. Allergra 180 - 1 tablet 1 time per day  7. Immunotherapy??? Will require skin tests without antihistamine  8. Return to clinic in 4 weeks or earlier if needed

## 2020-11-25 ENCOUNTER — Encounter: Payer: Self-pay | Admitting: Allergy and Immunology

## 2020-11-29 DIAGNOSIS — M47896 Other spondylosis, lumbar region: Secondary | ICD-10-CM | POA: Diagnosis not present

## 2020-11-29 DIAGNOSIS — G894 Chronic pain syndrome: Secondary | ICD-10-CM | POA: Diagnosis not present

## 2020-12-09 DIAGNOSIS — L570 Actinic keratosis: Secondary | ICD-10-CM | POA: Diagnosis not present

## 2020-12-09 DIAGNOSIS — L821 Other seborrheic keratosis: Secondary | ICD-10-CM | POA: Diagnosis not present

## 2020-12-09 DIAGNOSIS — L578 Other skin changes due to chronic exposure to nonionizing radiation: Secondary | ICD-10-CM | POA: Diagnosis not present

## 2020-12-09 DIAGNOSIS — L820 Inflamed seborrheic keratosis: Secondary | ICD-10-CM | POA: Diagnosis not present

## 2020-12-23 ENCOUNTER — Encounter: Payer: Self-pay | Admitting: Allergy and Immunology

## 2020-12-23 ENCOUNTER — Other Ambulatory Visit: Payer: Self-pay

## 2020-12-23 ENCOUNTER — Ambulatory Visit: Payer: Medicare Other | Admitting: Allergy and Immunology

## 2020-12-23 VITALS — BP 118/74 | HR 53 | Resp 16

## 2020-12-23 DIAGNOSIS — J3089 Other allergic rhinitis: Secondary | ICD-10-CM

## 2020-12-23 DIAGNOSIS — K219 Gastro-esophageal reflux disease without esophagitis: Secondary | ICD-10-CM | POA: Diagnosis not present

## 2020-12-23 DIAGNOSIS — T485X5A Adverse effect of other anti-common-cold drugs, initial encounter: Secondary | ICD-10-CM

## 2020-12-23 DIAGNOSIS — J310 Chronic rhinitis: Secondary | ICD-10-CM

## 2020-12-23 MED ORDER — IPRATROPIUM BROMIDE 0.06 % NA SOLN: NASAL | 12 refills | Status: DC

## 2020-12-23 NOTE — Patient Instructions (Addendum)
  1.  Continue montelukast 10mg  daily   2.  Use the following every morning (AM):   A. Nasal saline    B. Azelastine - 1 spray each nostril    C. Nasacort - 1 spray each nostril   3. Use the following every night (PM):   A. Nasal saline    B. Afrin - single spray in single nostril. Alternate nostrils nightly.  C. Azelastine - 1 spray each nostril    D. Nasacort - 1 spray each nostril   4.  Continue Pantoprazole 40 MG daily  5. If needed:  A. Allergra 180 - 1 tablet 1 time per day B. Ipratropium 0.06% - 2 sprays each nostril before eating meal.  6. Return to clinic in 1 year or earlier if problem

## 2020-12-23 NOTE — Progress Notes (Signed)
[place] - [place] - [place] - [place] -    Follow-up Note  Referring Provider: Johny Blamer, MD Primary Provider: Johny Blamer, MD Date of Office Visit: 12/23/2020  Subjective:   Oscar Ward. (DOB: 23-Feb-1945) is a 75 y.o. male who [facility] in re-evaluation of the following:  HPI: Oscar Ward returns to this clinic in evaluation of allergic rhinitis and LPR.  His last visit to this clinic was 21 November 2020.  During his last visit we changed around his medications and he has been consistently using a nasal antihistamine and a nasal steroid twice a day.  He has improved while utilizing this therapy but he still wakes up at night with congestion of his nose and must use an Afrin nose spray about 3-4 times per week.  As well, he has a history of gustatory rhinitis where his nose becomes very runny while he eats.  He has not been using any Zyrtec and his sedation associated with this agent has basically resolved.  His reflux is under excellent control.  Allergies as of 12/23/2020       Reactions   Marcaine [bupivacaine Hcl] Rash        Medication List    atorvastatin 20 MG tablet Commonly known as: LIPITOR Take 20 mg by mouth every evening.   azelastine 0.1 % nasal spray Commonly known as: ASTELIN Place 1 spray into both nostrils 2 (two) times daily.   butalbital-acetaminophen-caffeine 50-325-40 MG tablet Commonly known as: FIORICET Take 1 tablet by mouth as needed for headache.   fluticasone 50 MCG/ACT nasal spray Commonly known as: FLONASE 1-2 sprays each nostril 3-7 times per week   HYDROcodone-acetaminophen 10-325 MG tablet Commonly known as: NORCO TK 1 T PO Q 4 TO 6 H PRN P   IRON PO Take 325 mg by mouth daily.   losartan 100 MG tablet Commonly known as: COZAAR Take 100 mg by mouth every evening.   montelukast 10 MG tablet Commonly known as: SINGULAIR Take 1 tablet (10 mg total) by  mouth at bedtime.   multivitamin with minerals Tabs tablet Take 1 tablet by mouth daily.   pantoprazole 40 MG tablet Commonly known as: PROTONIX Take 1 tablet (40 mg total) by mouth daily.   triamcinolone 55 MCG/ACT Aero nasal inhaler Commonly known as: NASACORT Place 1 spray into the nose 2 (two) times daily.    Past Medical History:  Diagnosis Date   Arthritis    Chronic kidney disease    only one kidney since bitth   Fibromyalgia    GERD (gastroesophageal reflux disease)    Headache    Hypertension    PONV (postoperative nausea and vomiting)     Past Surgical History:  Procedure Laterality Date   ADENOIDECTOMY     APPENDECTOMY     CERVICAL FUSION     CHOLECYSTECTOMY     COLON SURGERY     megacolon decreased    HEMORRHOID SURGERY  1985   HERNIA REPAIR     times 2   JOINT REPLACEMENT Left    knee   MENISCUS REPAIR Right 2011   PROSTATE SURGERY  2006   right testicle      removed   SHOULDER SURGERY Left    Bone Spur   SINOSCOPY     TONSILLECTOMY     TOTAL KNEE REVISION Left 10/15/2015   Procedure: LEFT TOTAL KNEE REVISION, synovectomy with poly exchange;  Surgeon: Kathryne Hitch,  MD;  Location: [facility];  Service: Orthopedics;  Laterality: Left;    Review of systems negative except as noted in HPI / PMHx or noted below:  Review of Systems  Constitutional: Negative.   HENT: Negative.    Eyes: Negative.   Respiratory: Negative.    Cardiovascular: Negative.   Gastrointestinal: Negative.   Genitourinary: Negative.   Musculoskeletal: Negative.   Skin: Negative.   Neurological: Negative.   Endo/Heme/Allergies: Negative.   Psychiatric/Behavioral: Negative.      Objective:   Vitals:   12/23/20 1120  BP: 118/74  Pulse: (!) 53  Resp: 16  SpO2: 95%          Physical Exam Constitutional:      Appearance: He is not diaphoretic.  HENT:     Head: Normocephalic.     Right Ear: Tympanic membrane, ear canal and external ear normal.     Left Ear:  Tympanic membrane, ear canal and external ear normal.     Nose: Nose normal. No mucosal edema or rhinorrhea.     Mouth/Throat:     Pharynx: Uvula midline. No oropharyngeal exudate.  Eyes:     Conjunctiva/sclera: Conjunctivae normal.  Neck:     Thyroid: No thyromegaly.     Trachea: Trachea normal. No tracheal tenderness or tracheal deviation.  Cardiovascular:     Rate and Rhythm: Normal rate and regular rhythm.     Heart sounds: Normal heart sounds, S1 normal and S2 normal. No murmur heard. Pulmonary:     Effort: No respiratory distress.     Breath sounds: Normal breath sounds. No stridor. No wheezing or rales.  Lymphadenopathy:     Head:     Right side of head: No tonsillar adenopathy.     Left side of head: No tonsillar adenopathy.     Cervical: No cervical adenopathy.  Skin:    Findings: No erythema or rash.     Nails: There is no clubbing.  Neurological:     Mental Status: He is alert.    Diagnostics: none  Assessment and Plan:   1. Other allergic rhinitis   2. Rhinitis medicamentosa   3. Gustatory rhinitis   4. LPRD (laryngopharyngeal reflux disease)     1.  Continue montelukast 10mg  daily   2.  Use the following every morning (AM):   A. Nasal saline    B. Azelastine - 1 spray each nostril    C. Nasacort - 1 spray each nostril   3. Use the following every night (PM):   A. Nasal saline    B. Afrin - single spray in single nostril. Alternate nostrils nightly.  C. Azelastine - 1 spray each nostril    D. Nasacort - 1 spray each nostril   4.  Continue Pantoprazole 40 MG daily  5. If needed:  A. Allergra 180 - 1 tablet 1 time per day B. Ipratropium 0.06% - 2 sprays each nostril before eating meal.  6. Return to clinic in 1 year or earlier if problem  And lieu of having Ben discontinue his nasal decongestant, which does not appear as though it is going to occur, we will have him use a very low dose of nasal decongestant alternating nostrils from night to  night prior to him placing his nasal antihistamine and nasal steroid in his nostril.  As well, he does have a issue associated with gustatory rhinitis for which he can use nasal ipratropium.  Assuming he does well with the plan noted above I will  see him back in this clinic in 1 year or earlier if there is a problem.  Laurette Schimke, MD Allergy / Immunology [facility]

## 2020-12-24 ENCOUNTER — Encounter: Payer: Self-pay | Admitting: Allergy and Immunology

## 2020-12-30 DIAGNOSIS — M47896 Other spondylosis, lumbar region: Secondary | ICD-10-CM | POA: Diagnosis not present

## 2020-12-30 DIAGNOSIS — G894 Chronic pain syndrome: Secondary | ICD-10-CM | POA: Diagnosis not present

## 2021-01-30 DIAGNOSIS — G894 Chronic pain syndrome: Secondary | ICD-10-CM | POA: Diagnosis not present

## 2021-01-30 DIAGNOSIS — M47896 Other spondylosis, lumbar region: Secondary | ICD-10-CM | POA: Diagnosis not present

## 2021-02-04 ENCOUNTER — Telehealth: Payer: Self-pay | Admitting: Allergy and Immunology

## 2021-02-04 ENCOUNTER — Other Ambulatory Visit: Payer: Self-pay | Admitting: *Deleted

## 2021-02-04 MED ORDER — ALLEGRA ALLERGY 180 MG PO TABS: ORAL_TABLET | ORAL | 3 refills | Status: DC

## 2021-02-04 NOTE — Telephone Encounter (Signed)
[facility] has [facility]

## 2021-02-04 NOTE — Telephone Encounter (Signed)
Patient states in his last OV with Dr. Lucie Leather he mentioned how Zyrtec made him drowsy so he switched over to [facility]. Patient is requesting a prescription to be sent in for this because he has a coupon that will get him a 2 month supply for $16 which is cheaper for him.   [facility] [address]

## 2021-02-28 DIAGNOSIS — M47896 Other spondylosis, lumbar region: Secondary | ICD-10-CM | POA: Diagnosis not present

## 2021-02-28 DIAGNOSIS — G894 Chronic pain syndrome: Secondary | ICD-10-CM | POA: Diagnosis not present

## 2021-03-28 ENCOUNTER — Other Ambulatory Visit: Payer: Self-pay | Admitting: Allergy and Immunology

## 2021-03-28 DIAGNOSIS — M47896 Other spondylosis, lumbar region: Secondary | ICD-10-CM | POA: Diagnosis not present

## 2021-03-28 DIAGNOSIS — G894 Chronic pain syndrome: Secondary | ICD-10-CM | POA: Diagnosis not present

## 2021-04-17 DIAGNOSIS — J209 Acute bronchitis, unspecified: Secondary | ICD-10-CM | POA: Diagnosis not present

## 2021-04-17 DIAGNOSIS — J309 Allergic rhinitis, unspecified: Secondary | ICD-10-CM | POA: Diagnosis not present

## 2021-04-17 DIAGNOSIS — J0100 Acute maxillary sinusitis, unspecified: Secondary | ICD-10-CM | POA: Diagnosis not present

## 2021-04-29 DIAGNOSIS — G894 Chronic pain syndrome: Secondary | ICD-10-CM | POA: Diagnosis not present

## 2021-04-29 DIAGNOSIS — Z79891 Long term (current) use of opiate analgesic: Secondary | ICD-10-CM | POA: Diagnosis not present

## 2021-04-29 DIAGNOSIS — M47896 Other spondylosis, lumbar region: Secondary | ICD-10-CM | POA: Diagnosis not present

## 2021-04-29 DIAGNOSIS — Z79899 Other long term (current) drug therapy: Secondary | ICD-10-CM | POA: Diagnosis not present

## 2021-05-14 DIAGNOSIS — G8929 Other chronic pain: Secondary | ICD-10-CM | POA: Diagnosis not present

## 2021-05-14 DIAGNOSIS — K219 Gastro-esophageal reflux disease without esophagitis: Secondary | ICD-10-CM | POA: Diagnosis not present

## 2021-05-14 DIAGNOSIS — R519 Headache, unspecified: Secondary | ICD-10-CM | POA: Diagnosis not present

## 2021-05-14 DIAGNOSIS — Z0000 Encounter for general adult medical examination without abnormal findings: Secondary | ICD-10-CM | POA: Diagnosis not present

## 2021-05-14 DIAGNOSIS — I10 Essential (primary) hypertension: Secondary | ICD-10-CM | POA: Diagnosis not present

## 2021-05-14 DIAGNOSIS — R7303 Prediabetes: Secondary | ICD-10-CM | POA: Diagnosis not present

## 2021-05-14 DIAGNOSIS — E7800 Pure hypercholesterolemia, unspecified: Secondary | ICD-10-CM | POA: Diagnosis not present

## 2021-05-21 DIAGNOSIS — M47896 Other spondylosis, lumbar region: Secondary | ICD-10-CM | POA: Diagnosis not present

## 2021-05-21 DIAGNOSIS — G894 Chronic pain syndrome: Secondary | ICD-10-CM | POA: Diagnosis not present

## 2021-06-09 DIAGNOSIS — L578 Other skin changes due to chronic exposure to nonionizing radiation: Secondary | ICD-10-CM | POA: Diagnosis not present

## 2021-06-09 DIAGNOSIS — L821 Other seborrheic keratosis: Secondary | ICD-10-CM | POA: Diagnosis not present

## 2021-06-19 ENCOUNTER — Other Ambulatory Visit: Payer: Self-pay | Admitting: Allergy and Immunology

## 2021-06-20 ENCOUNTER — Other Ambulatory Visit: Payer: Self-pay | Admitting: Allergy and Immunology

## 2021-06-20 DIAGNOSIS — M47896 Other spondylosis, lumbar region: Secondary | ICD-10-CM | POA: Diagnosis not present

## 2021-06-20 DIAGNOSIS — G894 Chronic pain syndrome: Secondary | ICD-10-CM | POA: Diagnosis not present

## 2021-07-21 DIAGNOSIS — G894 Chronic pain syndrome: Secondary | ICD-10-CM | POA: Diagnosis not present

## 2021-07-21 DIAGNOSIS — M47896 Other spondylosis, lumbar region: Secondary | ICD-10-CM | POA: Diagnosis not present

## 2021-08-18 DIAGNOSIS — G894 Chronic pain syndrome: Secondary | ICD-10-CM | POA: Diagnosis not present

## 2021-09-03 ENCOUNTER — Other Ambulatory Visit: Payer: Self-pay | Admitting: Allergy and Immunology

## 2021-09-05 DIAGNOSIS — R051 Acute cough: Secondary | ICD-10-CM | POA: Diagnosis not present

## 2021-09-05 DIAGNOSIS — Z03818 Encounter for observation for suspected exposure to other biological agents ruled out: Secondary | ICD-10-CM | POA: Diagnosis not present

## 2021-09-05 DIAGNOSIS — J029 Acute pharyngitis, unspecified: Secondary | ICD-10-CM | POA: Diagnosis not present

## 2021-09-05 DIAGNOSIS — J069 Acute upper respiratory infection, unspecified: Secondary | ICD-10-CM | POA: Diagnosis not present

## 2021-09-16 DIAGNOSIS — G894 Chronic pain syndrome: Secondary | ICD-10-CM | POA: Diagnosis not present

## 2021-09-16 DIAGNOSIS — M47896 Other spondylosis, lumbar region: Secondary | ICD-10-CM | POA: Diagnosis not present

## 2021-09-30 ENCOUNTER — Telehealth: Payer: Self-pay | Admitting: Allergy and Immunology

## 2021-09-30 NOTE — Telephone Encounter (Signed)
Patient states his symptoms have been flared up and finds he does better taking his Pantoprazole twice daily. Is it OK for him to continue to do so?

## 2021-09-30 NOTE — Telephone Encounter (Signed)
Dr. Kozlow please advice. Thank you 

## 2021-10-01 NOTE — Telephone Encounter (Signed)
Patient informed and verbalized understanding

## 2021-10-16 DIAGNOSIS — M47896 Other spondylosis, lumbar region: Secondary | ICD-10-CM | POA: Diagnosis not present

## 2021-10-16 DIAGNOSIS — G894 Chronic pain syndrome: Secondary | ICD-10-CM | POA: Diagnosis not present

## 2021-11-06 ENCOUNTER — Other Ambulatory Visit: Payer: Self-pay | Admitting: *Deleted

## 2021-11-06 MED ORDER — PANTOPRAZOLE SODIUM 40 MG PO TBEC: DELAYED_RELEASE_TABLET | ORAL | 0 refills | Status: DC

## 2021-11-06 NOTE — Telephone Encounter (Signed)
RX sent

## 2021-11-06 NOTE — Telephone Encounter (Signed)
Patient is requesting we send in a refill for [facility] to [facility] on [address]. The script needs to be updated to say twice a day.

## 2021-11-14 DIAGNOSIS — Z79899 Other long term (current) drug therapy: Secondary | ICD-10-CM | POA: Diagnosis not present

## 2021-11-14 DIAGNOSIS — M47896 Other spondylosis, lumbar region: Secondary | ICD-10-CM | POA: Diagnosis not present

## 2021-11-14 DIAGNOSIS — Z79891 Long term (current) use of opiate analgesic: Secondary | ICD-10-CM | POA: Diagnosis not present

## 2021-11-14 DIAGNOSIS — M25552 Pain in left hip: Secondary | ICD-10-CM | POA: Diagnosis not present

## 2021-11-14 DIAGNOSIS — G894 Chronic pain syndrome: Secondary | ICD-10-CM | POA: Diagnosis not present

## 2021-12-09 DIAGNOSIS — L578 Other skin changes due to chronic exposure to nonionizing radiation: Secondary | ICD-10-CM | POA: Diagnosis not present

## 2021-12-09 DIAGNOSIS — L820 Inflamed seborrheic keratosis: Secondary | ICD-10-CM | POA: Diagnosis not present

## 2021-12-09 DIAGNOSIS — L570 Actinic keratosis: Secondary | ICD-10-CM | POA: Diagnosis not present

## 2021-12-09 DIAGNOSIS — L821 Other seborrheic keratosis: Secondary | ICD-10-CM | POA: Diagnosis not present

## 2021-12-11 ENCOUNTER — Other Ambulatory Visit: Payer: Self-pay | Admitting: Allergy and Immunology

## 2021-12-15 DIAGNOSIS — M47896 Other spondylosis, lumbar region: Secondary | ICD-10-CM | POA: Diagnosis not present

## 2021-12-15 DIAGNOSIS — G894 Chronic pain syndrome: Secondary | ICD-10-CM | POA: Diagnosis not present

## 2021-12-15 DIAGNOSIS — M25552 Pain in left hip: Secondary | ICD-10-CM | POA: Diagnosis not present

## 2021-12-24 ENCOUNTER — Encounter: Payer: Self-pay | Admitting: Allergy and Immunology

## 2021-12-24 ENCOUNTER — Ambulatory Visit: Payer: Medicare Other | Admitting: Allergy and Immunology

## 2021-12-24 VITALS — BP 136/82 | HR 67 | Resp 16 | Wt 193.6 lb

## 2021-12-24 DIAGNOSIS — K219 Gastro-esophageal reflux disease without esophagitis: Secondary | ICD-10-CM | POA: Diagnosis not present

## 2021-12-24 DIAGNOSIS — J3089 Other allergic rhinitis: Secondary | ICD-10-CM | POA: Diagnosis not present

## 2021-12-24 NOTE — Patient Instructions (Addendum)
  1.  Continue montelukast 10mg  daily   2.  Use the following every morning (AM):   A. Nasal saline    B. Azelastine - 1 spray each nostril    C. Flonase - 1 spray each nostril   3. Use the following every night (PM):   A. Nasal saline    B. Azelastine - 1 spray each nostril    C. Flonase - 1 spray each nostril   4.  Continue Pantoprazole 40 MG - 1 tablet 1-2 times per day  5. If needed:  A.  Cetirizine 10 mg - 1 tablet 1 time per day  6. Always minimize caffeine and chocolate consumption  7. Return to clinic in 1 year or earlier if problem

## 2021-12-24 NOTE — Progress Notes (Signed)
[place] - [place] - [place] - [place] -    Follow-up Note  Referring Provider: Johny Blamer, MD Primary Provider: Johny Blamer, MD Date of Office Visit: 12/24/2021  Subjective:   Oscar Ward. (DOB: 18-Jan-1946) is a 76 y.o. male who [facility]21 in re-evaluation of the following:  HPI: Oscar Ward returns to this clinic in evaluation allergic rhinitis and LPR.  I last saw him in this clinic 23 December 2020.  He has really done well with his airway issue while consistently using nasal azelastine and nasal steroid.  He also uses some nasal saline but has had no need to use any topical nasal decongestant.  He has not required a systemic steroid or antibiotic for any type of airway issue.  He had excellent control of his reflux while using his proton pump inhibitor twice a day.  He continued to have some problems while utilizing his proton pump inhibitor 1 time per day with some regurgitation and burning.  Should be noted that he does drink caffeinated fluid every day and occasionally has chocolate.  He has obtained his flu vaccine and his RSV vaccine.    Allergies as of 12/24/2021       Reactions   Marcaine [bupivacaine Hcl] Rash        Medication List    atorvastatin 20 MG tablet Commonly known as: LIPITOR Take 20 mg by mouth every evening.   azelastine 0.1 % nasal spray Commonly known as: ASTELIN Place 1 spray into both nostrils 2 (two) times daily.   butalbital-acetaminophen-caffeine 50-325-40 MG tablet Commonly known as: FIORICET Take 1 tablet by mouth as needed for headache.   cetirizine 10 MG tablet Commonly known as: ZYRTEC TAKE 1 TABLET BY MOUTH EVERYDAY AT BEDTIME   fluticasone 50 MCG/ACT nasal spray Commonly known as: FLONASE 1-2 SPRAYS EACH NOSTRIL 3-7 TIMES PER WEEK   HYDROcodone-acetaminophen 10-325 MG tablet Commonly known as: NORCO TK 1 T PO Q 4 TO 6 H PRN P   IRON PO Take 325 mg by  mouth daily.   losartan 100 MG tablet Commonly known as: COZAAR Take 100 mg by mouth every evening.   montelukast 10 MG tablet Commonly known as: SINGULAIR TAKE 1 TABLET BY MOUTH EVERYDAY AT BEDTIME   multivitamin with minerals Tabs tablet Take 1 tablet by mouth daily.   pantoprazole 40 MG tablet Commonly known as: PROTONIX Take one tablet twice daily   triamcinolone 55 MCG/ACT Aero nasal inhaler Commonly known as: NASACORT Place 1 spray into the nose 2 (two) times daily.        Past Medical History:  Diagnosis Date  . Arthritis   . Chronic kidney disease    only one kidney since bitth  . Fibromyalgia   . GERD (gastroesophageal reflux disease)   . Headache   . Hypertension   . PONV (postoperative nausea and vomiting)     Past Surgical History:  Procedure Laterality Date  . ADENOIDECTOMY    . APPENDECTOMY    . CERVICAL FUSION    . CHOLECYSTECTOMY    . COLON SURGERY     megacolon decreased   . HEMORRHOID SURGERY  1985  . HERNIA REPAIR     times 2  . JOINT REPLACEMENT Left    knee  . MENISCUS REPAIR Right 2011  . PROSTATE SURGERY  2006  . right testicle      removed  . SHOULDER SURGERY Left    Bone Spur  .  SINOSCOPY    . TONSILLECTOMY    . TOTAL KNEE REVISION Left 10/15/2015   Procedure: LEFT TOTAL KNEE REVISION, synovectomy with poly exchange;  Surgeon: Kathryne Hitch, MD;  Location: [facility];  Service: Orthopedics;  Laterality: Left;    Review of systems negative except as noted in HPI / PMHx or noted below:  Review of Systems  Constitutional: Negative.   HENT: Negative.    Eyes: Negative.   Respiratory: Negative.    Cardiovascular: Negative.   Gastrointestinal: Negative.   Genitourinary: Negative.   Musculoskeletal: Negative.   Skin: Negative.   Neurological: Negative.   Endo/Heme/Allergies: Negative.   Psychiatric/Behavioral: Negative.       Objective:   Vitals:   12/24/21 1116  BP: 136/82  Pulse: 67  Resp: 16  SpO2: 95%       Weight: 193 lb 9.6 oz (87.8 kg)   Physical Exam Constitutional:      Appearance: He is not diaphoretic.  HENT:     Head: Normocephalic.     Right Ear: Tympanic membrane, ear canal and external ear normal.     Left Ear: Tympanic membrane, ear canal and external ear normal.     Nose: Nose normal. No mucosal edema or rhinorrhea.     Mouth/Throat:     Pharynx: Uvula midline. No oropharyngeal exudate.  Eyes:     Conjunctiva/sclera: Conjunctivae normal.  Neck:     Thyroid: No thyromegaly.     Trachea: Trachea normal. No tracheal tenderness or tracheal deviation.  Cardiovascular:     Rate and Rhythm: Normal rate and regular rhythm.     Heart sounds: Normal heart sounds, S1 normal and S2 normal. No murmur heard. Pulmonary:     Effort: No respiratory distress.     Breath sounds: Normal breath sounds. No stridor. No wheezing or rales.  Lymphadenopathy:     Head:     Right side of head: No tonsillar adenopathy.     Left side of head: No tonsillar adenopathy.     Cervical: No cervical adenopathy.  Skin:    Findings: No erythema or rash.     Nails: There is no clubbing.  Neurological:     Mental Status: He is alert.    Diagnostics: none  Assessment and Plan:   1. Other allergic rhinitis   2. LPRD (laryngopharyngeal reflux disease)    1.  Continue montelukast 10mg  daily   2.  Use the following every morning (AM):   A. Nasal saline    B. Azelastine - 1 spray each nostril    C. Flonase - 1 spray each nostril   3. Use the following every night (PM):   A. Nasal saline    B. Azelastine - 1 spray each nostril    C. Flonase - 1 spray each nostril   4.  Continue Pantoprazole 40 MG - 1 tablet 1-2 times per day  5. If needed:  A.  Cetirizine 10 mg - 1 tablet 1 time per day  6. Always minimize caffeine and chocolate consumption  7. Return to clinic in 1 year or earlier if problem  , MD Allergy / Immunology [facility]

## 2021-12-25 ENCOUNTER — Encounter: Payer: Self-pay | Admitting: Allergy and Immunology

## 2022-01-08 DIAGNOSIS — I700 Atherosclerosis of aorta: Secondary | ICD-10-CM | POA: Diagnosis not present

## 2022-01-08 DIAGNOSIS — N201 Calculus of ureter: Secondary | ICD-10-CM | POA: Diagnosis not present

## 2022-01-08 DIAGNOSIS — N132 Hydronephrosis with renal and ureteral calculous obstruction: Secondary | ICD-10-CM | POA: Diagnosis not present

## 2022-01-08 DIAGNOSIS — R109 Unspecified abdominal pain: Secondary | ICD-10-CM | POA: Diagnosis not present

## 2022-01-08 DIAGNOSIS — R339 Retention of urine, unspecified: Secondary | ICD-10-CM | POA: Diagnosis not present

## 2022-01-08 DIAGNOSIS — Z9049 Acquired absence of other specified parts of digestive tract: Secondary | ICD-10-CM | POA: Diagnosis not present

## 2022-01-12 DIAGNOSIS — N201 Calculus of ureter: Secondary | ICD-10-CM | POA: Diagnosis not present

## 2022-01-14 DIAGNOSIS — G894 Chronic pain syndrome: Secondary | ICD-10-CM | POA: Diagnosis not present

## 2022-01-14 DIAGNOSIS — M47896 Other spondylosis, lumbar region: Secondary | ICD-10-CM | POA: Diagnosis not present

## 2022-01-14 DIAGNOSIS — M25552 Pain in left hip: Secondary | ICD-10-CM | POA: Diagnosis not present

## 2022-02-08 ENCOUNTER — Other Ambulatory Visit: Payer: Self-pay | Admitting: Allergy and Immunology

## 2022-02-09 ENCOUNTER — Other Ambulatory Visit: Payer: Self-pay | Admitting: Allergy and Immunology

## 2022-02-11 DIAGNOSIS — M47896 Other spondylosis, lumbar region: Secondary | ICD-10-CM | POA: Diagnosis not present

## 2022-02-11 DIAGNOSIS — G894 Chronic pain syndrome: Secondary | ICD-10-CM | POA: Diagnosis not present

## 2022-02-11 DIAGNOSIS — M25552 Pain in left hip: Secondary | ICD-10-CM | POA: Diagnosis not present

## 2022-03-05 DIAGNOSIS — M720 Palmar fascial fibromatosis [Dupuytren]: Secondary | ICD-10-CM | POA: Diagnosis not present

## 2022-03-12 DIAGNOSIS — L570 Actinic keratosis: Secondary | ICD-10-CM | POA: Diagnosis not present

## 2022-03-12 DIAGNOSIS — L578 Other skin changes due to chronic exposure to nonionizing radiation: Secondary | ICD-10-CM | POA: Diagnosis not present

## 2022-03-13 DIAGNOSIS — G894 Chronic pain syndrome: Secondary | ICD-10-CM | POA: Diagnosis not present

## 2022-03-13 DIAGNOSIS — M47896 Other spondylosis, lumbar region: Secondary | ICD-10-CM | POA: Diagnosis not present

## 2022-04-06 DIAGNOSIS — R519 Headache, unspecified: Secondary | ICD-10-CM | POA: Diagnosis not present

## 2022-04-06 DIAGNOSIS — H6692 Otitis media, unspecified, left ear: Secondary | ICD-10-CM | POA: Diagnosis not present

## 2022-04-06 DIAGNOSIS — R0981 Nasal congestion: Secondary | ICD-10-CM | POA: Diagnosis not present

## 2022-04-20 DIAGNOSIS — C44319 Basal cell carcinoma of skin of other parts of face: Secondary | ICD-10-CM | POA: Diagnosis not present

## 2022-04-20 DIAGNOSIS — L570 Actinic keratosis: Secondary | ICD-10-CM | POA: Diagnosis not present

## 2022-05-12 DIAGNOSIS — G894 Chronic pain syndrome: Secondary | ICD-10-CM | POA: Diagnosis not present

## 2022-05-12 DIAGNOSIS — Z79899 Other long term (current) drug therapy: Secondary | ICD-10-CM | POA: Diagnosis not present

## 2022-05-12 DIAGNOSIS — M47896 Other spondylosis, lumbar region: Secondary | ICD-10-CM | POA: Diagnosis not present

## 2022-05-12 DIAGNOSIS — Z79891 Long term (current) use of opiate analgesic: Secondary | ICD-10-CM | POA: Diagnosis not present

## 2022-05-19 DIAGNOSIS — I10 Essential (primary) hypertension: Secondary | ICD-10-CM | POA: Diagnosis not present

## 2022-05-19 DIAGNOSIS — E7800 Pure hypercholesterolemia, unspecified: Secondary | ICD-10-CM | POA: Diagnosis not present

## 2022-05-19 DIAGNOSIS — G8929 Other chronic pain: Secondary | ICD-10-CM | POA: Diagnosis not present

## 2022-05-19 DIAGNOSIS — R519 Headache, unspecified: Secondary | ICD-10-CM | POA: Diagnosis not present

## 2022-05-19 DIAGNOSIS — R7303 Prediabetes: Secondary | ICD-10-CM | POA: Diagnosis not present

## 2022-05-19 DIAGNOSIS — K219 Gastro-esophageal reflux disease without esophagitis: Secondary | ICD-10-CM | POA: Diagnosis not present

## 2022-05-19 DIAGNOSIS — Z0000 Encounter for general adult medical examination without abnormal findings: Secondary | ICD-10-CM | POA: Diagnosis not present

## 2022-06-05 DIAGNOSIS — H905 Unspecified sensorineural hearing loss: Secondary | ICD-10-CM | POA: Diagnosis not present

## 2022-06-05 DIAGNOSIS — M47896 Other spondylosis, lumbar region: Secondary | ICD-10-CM | POA: Diagnosis not present

## 2022-06-05 DIAGNOSIS — G894 Chronic pain syndrome: Secondary | ICD-10-CM | POA: Diagnosis not present

## 2022-06-09 DIAGNOSIS — R55 Syncope and collapse: Secondary | ICD-10-CM | POA: Diagnosis not present

## 2022-06-09 DIAGNOSIS — Z20822 Contact with and (suspected) exposure to covid-19: Secondary | ICD-10-CM | POA: Diagnosis not present

## 2022-06-09 DIAGNOSIS — Z9049 Acquired absence of other specified parts of digestive tract: Secondary | ICD-10-CM | POA: Diagnosis not present

## 2022-06-09 DIAGNOSIS — R112 Nausea with vomiting, unspecified: Secondary | ICD-10-CM | POA: Diagnosis not present

## 2022-06-09 DIAGNOSIS — R3911 Hesitancy of micturition: Secondary | ICD-10-CM | POA: Diagnosis not present

## 2022-06-09 DIAGNOSIS — K439 Ventral hernia without obstruction or gangrene: Secondary | ICD-10-CM | POA: Diagnosis not present

## 2022-06-09 DIAGNOSIS — R059 Cough, unspecified: Secondary | ICD-10-CM | POA: Diagnosis not present

## 2022-06-09 DIAGNOSIS — N10 Acute tubulo-interstitial nephritis: Secondary | ICD-10-CM | POA: Diagnosis not present

## 2022-06-09 DIAGNOSIS — Z1152 Encounter for screening for COVID-19: Secondary | ICD-10-CM | POA: Diagnosis not present

## 2022-06-09 DIAGNOSIS — Y31XXXA Falling, lying or running before or into moving object, undetermined intent, initial encounter: Secondary | ICD-10-CM | POA: Diagnosis not present

## 2022-06-09 DIAGNOSIS — R509 Fever, unspecified: Secondary | ICD-10-CM | POA: Diagnosis not present

## 2022-06-09 DIAGNOSIS — R519 Headache, unspecified: Secondary | ICD-10-CM | POA: Diagnosis not present

## 2022-06-09 DIAGNOSIS — N2889 Other specified disorders of kidney and ureter: Secondary | ICD-10-CM | POA: Diagnosis not present

## 2022-06-27 ENCOUNTER — Other Ambulatory Visit: Payer: Self-pay | Admitting: Allergy and Immunology

## 2022-07-09 DIAGNOSIS — M47896 Other spondylosis, lumbar region: Secondary | ICD-10-CM | POA: Diagnosis not present

## 2022-07-09 DIAGNOSIS — G894 Chronic pain syndrome: Secondary | ICD-10-CM | POA: Diagnosis not present

## 2022-07-23 DIAGNOSIS — R21 Rash and other nonspecific skin eruption: Secondary | ICD-10-CM | POA: Diagnosis not present

## 2022-07-23 DIAGNOSIS — L255 Unspecified contact dermatitis due to plants, except food: Secondary | ICD-10-CM | POA: Diagnosis not present

## 2022-07-23 DIAGNOSIS — S60452A Superficial foreign body of right middle finger, initial encounter: Secondary | ICD-10-CM | POA: Diagnosis not present

## 2022-09-07 DIAGNOSIS — M47896 Other spondylosis, lumbar region: Secondary | ICD-10-CM | POA: Diagnosis not present

## 2022-09-07 DIAGNOSIS — G894 Chronic pain syndrome: Secondary | ICD-10-CM | POA: Diagnosis not present

## 2022-09-10 ENCOUNTER — Other Ambulatory Visit: Payer: Self-pay | Admitting: Orthopedic Surgery

## 2022-09-10 DIAGNOSIS — M47896 Other spondylosis, lumbar region: Secondary | ICD-10-CM

## 2022-09-10 DIAGNOSIS — G894 Chronic pain syndrome: Secondary | ICD-10-CM

## 2022-09-30 ENCOUNTER — Other Ambulatory Visit: Payer: Self-pay | Admitting: Allergy and Immunology

## 2022-10-02 DIAGNOSIS — M47896 Other spondylosis, lumbar region: Secondary | ICD-10-CM | POA: Diagnosis not present

## 2022-10-02 DIAGNOSIS — G894 Chronic pain syndrome: Secondary | ICD-10-CM | POA: Diagnosis not present

## 2022-10-20 DIAGNOSIS — R0981 Nasal congestion: Secondary | ICD-10-CM | POA: Diagnosis not present

## 2022-10-28 DIAGNOSIS — M545 Low back pain, unspecified: Secondary | ICD-10-CM | POA: Diagnosis not present

## 2022-10-28 DIAGNOSIS — I878 Other specified disorders of veins: Secondary | ICD-10-CM | POA: Diagnosis not present

## 2022-10-28 DIAGNOSIS — Z87442 Personal history of urinary calculi: Secondary | ICD-10-CM | POA: Diagnosis not present

## 2022-11-26 DIAGNOSIS — K219 Gastro-esophageal reflux disease without esophagitis: Secondary | ICD-10-CM | POA: Diagnosis not present

## 2022-12-08 DIAGNOSIS — Z79899 Other long term (current) drug therapy: Secondary | ICD-10-CM | POA: Diagnosis not present

## 2022-12-08 DIAGNOSIS — G894 Chronic pain syndrome: Secondary | ICD-10-CM | POA: Diagnosis not present

## 2022-12-08 DIAGNOSIS — M47896 Other spondylosis, lumbar region: Secondary | ICD-10-CM | POA: Diagnosis not present

## 2022-12-08 DIAGNOSIS — Z79891 Long term (current) use of opiate analgesic: Secondary | ICD-10-CM | POA: Diagnosis not present

## 2022-12-16 ENCOUNTER — Ambulatory Visit (INDEPENDENT_AMBULATORY_CARE_PROVIDER_SITE_OTHER): Payer: Medicare Other | Admitting: Allergy and Immunology

## 2022-12-16 ENCOUNTER — Encounter: Payer: Self-pay | Admitting: Allergy and Immunology

## 2022-12-16 VITALS — BP 128/72 | HR 48 | Resp 14 | Ht 69.4 in | Wt 186.4 lb

## 2022-12-16 DIAGNOSIS — J3089 Other allergic rhinitis: Secondary | ICD-10-CM | POA: Diagnosis not present

## 2022-12-16 DIAGNOSIS — K219 Gastro-esophageal reflux disease without esophagitis: Secondary | ICD-10-CM | POA: Diagnosis not present

## 2022-12-16 MED ORDER — AZELASTINE HCL 137 MCG/SPRAY NA SOLN: NASAL | 5 refills | Status: AC

## 2022-12-16 NOTE — Progress Notes (Unsigned)
[place] - [place] - [place] - [place] - [place]   Follow-up Note  Referring Provider: Noberto Retort, MD Primary Provider: Noberto Retort, MD Date of Office Visit: 12/16/2022  Subjective:   Oscar Ward. (DOB: 1945-02-13) is a 77 y.o. male who [facility] 12[facility]16/2022 in re-evaluation of the following:  HPI: Oscar Ward returns to this clinic in reevaluation of allergic rhinitis and LPR.  I last saw him in this clinic 24 December 2021.  He appears to be doing very well with his upper airway while consistently using a nasal steroid and nasal antihistamine 2 times a day and he has not required a systemic steroid or an antibiotic for any type of airway issue.  He appears to be doing quite well regarding his reflux and his throat problem while using pantoprazole mostly 1 time per day but occasionally twice a day.  He had a bout of kidney stones in his unilateral solitary kidney beginning 2024 but fortunately this issue appears to have resolved.  He did receive the flu vaccine, RSV vaccine, shingles vaccine.  Allergies as of 12/16/2022       Reactions   Marcaine [bupivacaine Hcl] Rash        Medication List    atorvastatin 20 MG tablet Commonly known as: LIPITOR Take 20 mg by mouth every evening.   azelastine 0.1 % nasal spray Commonly known as: ASTELIN Place 1 spray into both nostrils 2 (two) times daily.   butalbital-acetaminophen-caffeine 50-325-40 MG tablet Commonly known as: FIORICET Take 1 tablet by mouth as needed for headache.   cetirizine 10 MG tablet Commonly known as: ZYRTEC TAKE 1 TABLET BY MOUTH EVERYDAY AT BEDTIME   fluticasone 50 MCG/ACT nasal spray Commonly known as: FLONASE 1-2 SPRAYS EACH NOSTRIL 3-7 TIMES PER WEEK   HYDROcodone-acetaminophen 10-325 MG tablet Commonly known as: NORCO TK 1 T PO Q 4 TO 6 H PRN P   IRON PO Take 325 mg by mouth daily.   losartan 100 MG tablet Commonly known as:  COZAAR Take 100 mg by mouth every evening.   montelukast 10 MG tablet Commonly known as: SINGULAIR TAKE 1 TABLET BY MOUTH EVERYDAY AT BEDTIME   NASAL SALINE NA Place into the nose in the morning and at bedtime.   pantoprazole 40 MG tablet Commonly known as: PROTONIX TAKE 1 TABLET BY MOUTH EVERY DAY      Past Medical History:  Diagnosis Date   Arthritis    Chronic kidney disease    only one kidney since bitth   Fibromyalgia    GERD (gastroesophageal reflux disease)    Headache    Hypertension    PONV (postoperative nausea and vomiting)     Past Surgical History:  Procedure Laterality Date   ADENOIDECTOMY     APPENDECTOMY     CERVICAL FUSION     CHOLECYSTECTOMY     COLON SURGERY     megacolon decreased    HEMORRHOID SURGERY  1985   HERNIA REPAIR     times 2   JOINT REPLACEMENT Left    knee   MENISCUS REPAIR Right 2011   PROSTATE SURGERY  2006   right testicle      removed   SHOULDER SURGERY Left    Bone Spur   SINOSCOPY     TONSILLECTOMY     TOTAL KNEE REVISION Left 10/15/2015   Procedure: LEFT TOTAL KNEE REVISION, synovectomy with poly exchange;  Surgeon: Kathryne Hitch,  MD;  Location[facility];  Service: Orthopedics;  Laterality: Left;    Review of systems negative except as noted in HPI / PMHx or noted below:  Review of Systems  Constitutional: Negative.   HENT: Negative.    Eyes: Negative.   Respiratory: Negative.    Cardiovascular: Negative.   Gastrointestinal: Negative.   Genitourinary: Negative.   Musculoskeletal: Negative.   Skin: Negative.   Neurological: Negative.   Endo/Heme/Allergies: Negative.   Psychiatric/Behavioral: Negative.       Objective:   Vitals:   12/16/22 1348  BP: 128/72  Pulse: (!) 48  Resp: 14  SpO2: 95%   Height: 5' 9.4" (176.3 cm)  Weight: 186 lb 6.4 oz (84.6 kg)   Physical Exam Constitutional:      Appearance: He is not diaphoretic.  HENT:     Head: Normocephalic.     Right Ear: Tympanic membrane,  ear canal and external ear normal.     Left Ear: Tympanic membrane, ear canal and external ear normal.     Nose: Nose normal. No mucosal edema or rhinorrhea.     Mouth/Throat:     Pharynx: Uvula midline. No oropharyngeal exudate.  Eyes:     Conjunctiva/sclera: Conjunctivae normal.  Neck:     Thyroid: No thyromegaly.     Trachea: Trachea normal. No tracheal tenderness or tracheal deviation.  Cardiovascular:     Rate and Rhythm: Normal rate and regular rhythm.     Heart sounds: Normal heart sounds, S1 normal and S2 normal. No murmur heard. Pulmonary:     Effort: No respiratory distress.     Breath sounds: Normal breath sounds. No stridor. No wheezing or rales.  Lymphadenopathy:     Head:     Right side of head: No tonsillar adenopathy.     Left side of head: No tonsillar adenopathy.     Cervical: No cervical adenopathy.  Skin:    Findings: No erythema or rash.     Nails: There is no clubbing.  Neurological:     Mental Status: He is alert.     Diagnostics: none  Assessment and Plan:   1. Other allergic rhinitis   2. LPRD (laryngopharyngeal reflux disease)    1.  Continue montelukast 10mg  daily   2.  Use the following every morning (AM):   A. Nasal saline    B. Azelastine - 1 spray each nostril    C. Flonase - 1 spray each nostril   3. Use the following every night (PM):   A. Nasal saline    B. Azelastine - 1 spray each nostril    C. Flonase - 1 spray each nostril   4.  Continue Pantoprazole 40 MG - 1 tablet 1-2 times per day  5. If needed:  A.  Cetirizine 10 mg - 1 tablet 1 time per day  6. Return to clinic in 1 year or earlier if problem  Oscar Ward appears to be doing very well and he will continue on anti-inflammatory agents for his airway and therapy directed against reflux and assuming he does well with this plan I will see him back in this clinic in 1 year or earlier if there is a problem.  Laurette Schimke, MD Allergy / Immunology [facility]

## 2022-12-16 NOTE — Patient Instructions (Addendum)
  1.  Continue montelukast 10mg  daily   2.  Use the following every morning (AM):   A. Nasal saline    B. Azelastine - 1 spray each nostril    C. Flonase - 1 spray each nostril   3. Use the following every night (PM):   A. Nasal saline    B. Azelastine - 1 spray each nostril    C. Flonase - 1 spray each nostril   4.  Continue Pantoprazole 40 MG - 1 tablet 1-2 times per day  5. If needed:  A.  Cetirizine 10 mg - 1 tablet 1 time per day  6. Return to clinic in 1 year or earlier if problem

## 2022-12-17 ENCOUNTER — Encounter: Payer: Self-pay | Admitting: Allergy and Immunology

## 2022-12-31 DIAGNOSIS — Z860101 Personal history of adenomatous and serrated colon polyps: Secondary | ICD-10-CM | POA: Diagnosis not present

## 2022-12-31 DIAGNOSIS — D123 Benign neoplasm of transverse colon: Secondary | ICD-10-CM | POA: Diagnosis not present

## 2022-12-31 DIAGNOSIS — D120 Benign neoplasm of cecum: Secondary | ICD-10-CM | POA: Diagnosis not present

## 2022-12-31 DIAGNOSIS — Z09 Encounter for follow-up examination after completed treatment for conditions other than malignant neoplasm: Secondary | ICD-10-CM | POA: Diagnosis not present

## 2023-01-04 DIAGNOSIS — D123 Benign neoplasm of transverse colon: Secondary | ICD-10-CM | POA: Diagnosis not present

## 2023-01-04 DIAGNOSIS — D120 Benign neoplasm of cecum: Secondary | ICD-10-CM | POA: Diagnosis not present

## 2023-01-17 ENCOUNTER — Other Ambulatory Visit: Payer: Self-pay | Admitting: Allergy and Immunology

## 2023-02-03 DIAGNOSIS — Z87442 Personal history of urinary calculi: Secondary | ICD-10-CM | POA: Diagnosis not present

## 2023-02-03 DIAGNOSIS — R1031 Right lower quadrant pain: Secondary | ICD-10-CM | POA: Diagnosis not present

## 2023-02-05 DIAGNOSIS — M47896 Other spondylosis, lumbar region: Secondary | ICD-10-CM | POA: Diagnosis not present

## 2023-02-05 DIAGNOSIS — G894 Chronic pain syndrome: Secondary | ICD-10-CM | POA: Diagnosis not present

## 2023-03-02 DIAGNOSIS — M47896 Other spondylosis, lumbar region: Secondary | ICD-10-CM | POA: Diagnosis not present

## 2023-03-02 DIAGNOSIS — M47816 Spondylosis without myelopathy or radiculopathy, lumbar region: Secondary | ICD-10-CM | POA: Diagnosis not present

## 2023-03-02 DIAGNOSIS — M48061 Spinal stenosis, lumbar region without neurogenic claudication: Secondary | ICD-10-CM | POA: Diagnosis not present

## 2023-03-09 DIAGNOSIS — H25813 Combined forms of age-related cataract, bilateral: Secondary | ICD-10-CM | POA: Diagnosis not present

## 2023-03-10 DIAGNOSIS — M47816 Spondylosis without myelopathy or radiculopathy, lumbar region: Secondary | ICD-10-CM | POA: Diagnosis not present

## 2023-03-12 ENCOUNTER — Telehealth: Payer: Self-pay | Admitting: Allergy and Immunology

## 2023-03-12 ENCOUNTER — Other Ambulatory Visit: Payer: Self-pay | Admitting: *Deleted

## 2023-03-12 MED ORDER — PANTOPRAZOLE SODIUM 40 MG PO TBEC: DELAYED_RELEASE_TABLET | ORAL | 3 refills | Status: DC

## 2023-03-12 NOTE — Telephone Encounter (Signed)
Rx sent

## 2023-03-12 NOTE — Telephone Encounter (Signed)
Patient states Dr. Lucie Leather gave him the OK to take Pantoprazole 2 times a day. Patient would like this sent in with it saying 2 times a day rather than 1 tablet a day.   [facility] [address]

## 2023-03-27 ENCOUNTER — Other Ambulatory Visit: Payer: Self-pay | Admitting: Allergy and Immunology

## 2023-03-29 DIAGNOSIS — M79641 Pain in right hand: Secondary | ICD-10-CM | POA: Diagnosis not present

## 2023-03-29 DIAGNOSIS — M19042 Primary osteoarthritis, left hand: Secondary | ICD-10-CM | POA: Diagnosis not present

## 2023-03-29 DIAGNOSIS — M79642 Pain in left hand: Secondary | ICD-10-CM | POA: Diagnosis not present

## 2023-03-29 DIAGNOSIS — M1811 Unilateral primary osteoarthritis of first carpometacarpal joint, right hand: Secondary | ICD-10-CM | POA: Diagnosis not present

## 2023-04-05 DIAGNOSIS — M47816 Spondylosis without myelopathy or radiculopathy, lumbar region: Secondary | ICD-10-CM | POA: Diagnosis not present

## 2023-04-20 DIAGNOSIS — L821 Other seborrheic keratosis: Secondary | ICD-10-CM | POA: Diagnosis not present

## 2023-04-20 DIAGNOSIS — L570 Actinic keratosis: Secondary | ICD-10-CM | POA: Diagnosis not present

## 2023-04-20 DIAGNOSIS — L578 Other skin changes due to chronic exposure to nonionizing radiation: Secondary | ICD-10-CM | POA: Diagnosis not present

## 2023-04-20 DIAGNOSIS — L820 Inflamed seborrheic keratosis: Secondary | ICD-10-CM | POA: Diagnosis not present

## 2023-04-21 ENCOUNTER — Other Ambulatory Visit: Payer: Self-pay | Admitting: Allergy and Immunology

## 2023-04-21 DIAGNOSIS — M47816 Spondylosis without myelopathy or radiculopathy, lumbar region: Secondary | ICD-10-CM | POA: Diagnosis not present

## 2023-05-19 DIAGNOSIS — M47816 Spondylosis without myelopathy or radiculopathy, lumbar region: Secondary | ICD-10-CM | POA: Diagnosis not present

## 2023-05-25 DIAGNOSIS — K219 Gastro-esophageal reflux disease without esophagitis: Secondary | ICD-10-CM | POA: Diagnosis not present

## 2023-05-25 DIAGNOSIS — Q600 Renal agenesis, unilateral: Secondary | ICD-10-CM | POA: Diagnosis not present

## 2023-05-25 DIAGNOSIS — R519 Headache, unspecified: Secondary | ICD-10-CM | POA: Diagnosis not present

## 2023-05-25 DIAGNOSIS — E7800 Pure hypercholesterolemia, unspecified: Secondary | ICD-10-CM | POA: Diagnosis not present

## 2023-05-25 DIAGNOSIS — G8929 Other chronic pain: Secondary | ICD-10-CM | POA: Diagnosis not present

## 2023-05-25 DIAGNOSIS — R7303 Prediabetes: Secondary | ICD-10-CM | POA: Diagnosis not present

## 2023-05-25 DIAGNOSIS — I10 Essential (primary) hypertension: Secondary | ICD-10-CM | POA: Diagnosis not present

## 2023-05-25 DIAGNOSIS — Z0000 Encounter for general adult medical examination without abnormal findings: Secondary | ICD-10-CM | POA: Diagnosis not present

## 2023-06-14 DIAGNOSIS — M79642 Pain in left hand: Secondary | ICD-10-CM | POA: Diagnosis not present

## 2023-06-14 DIAGNOSIS — M79641 Pain in right hand: Secondary | ICD-10-CM | POA: Diagnosis not present

## 2023-06-14 DIAGNOSIS — M65331 Trigger finger, right middle finger: Secondary | ICD-10-CM | POA: Diagnosis not present

## 2023-06-14 DIAGNOSIS — M65332 Trigger finger, left middle finger: Secondary | ICD-10-CM | POA: Diagnosis not present

## 2023-06-14 DIAGNOSIS — M720 Palmar fascial fibromatosis [Dupuytren]: Secondary | ICD-10-CM | POA: Diagnosis not present

## 2023-07-02 DIAGNOSIS — M545 Low back pain, unspecified: Secondary | ICD-10-CM | POA: Diagnosis not present

## 2023-07-02 DIAGNOSIS — G894 Chronic pain syndrome: Secondary | ICD-10-CM | POA: Diagnosis not present

## 2023-07-02 DIAGNOSIS — M47816 Spondylosis without myelopathy or radiculopathy, lumbar region: Secondary | ICD-10-CM | POA: Diagnosis not present

## 2023-07-12 DIAGNOSIS — M65332 Trigger finger, left middle finger: Secondary | ICD-10-CM | POA: Diagnosis not present

## 2023-07-12 DIAGNOSIS — M720 Palmar fascial fibromatosis [Dupuytren]: Secondary | ICD-10-CM | POA: Diagnosis not present

## 2023-07-12 DIAGNOSIS — M65331 Trigger finger, right middle finger: Secondary | ICD-10-CM | POA: Diagnosis not present

## 2023-07-16 DIAGNOSIS — R21 Rash and other nonspecific skin eruption: Secondary | ICD-10-CM | POA: Diagnosis not present

## 2023-07-16 DIAGNOSIS — L299 Pruritus, unspecified: Secondary | ICD-10-CM | POA: Diagnosis not present

## 2023-08-02 DIAGNOSIS — G8929 Other chronic pain: Secondary | ICD-10-CM | POA: Diagnosis not present

## 2023-08-02 DIAGNOSIS — M47816 Spondylosis without myelopathy or radiculopathy, lumbar region: Secondary | ICD-10-CM | POA: Diagnosis not present

## 2023-08-02 DIAGNOSIS — M545 Low back pain, unspecified: Secondary | ICD-10-CM | POA: Diagnosis not present

## 2023-08-13 DIAGNOSIS — Z87442 Personal history of urinary calculi: Secondary | ICD-10-CM | POA: Diagnosis not present

## 2023-08-13 DIAGNOSIS — N2889 Other specified disorders of kidney and ureter: Secondary | ICD-10-CM | POA: Diagnosis not present

## 2023-08-27 DIAGNOSIS — M545 Low back pain, unspecified: Secondary | ICD-10-CM | POA: Diagnosis not present

## 2023-08-27 DIAGNOSIS — G8929 Other chronic pain: Secondary | ICD-10-CM | POA: Diagnosis not present

## 2023-08-27 DIAGNOSIS — M47816 Spondylosis without myelopathy or radiculopathy, lumbar region: Secondary | ICD-10-CM | POA: Diagnosis not present

## 2023-09-22 ENCOUNTER — Other Ambulatory Visit: Payer: Self-pay | Admitting: Allergy and Immunology

## 2023-10-01 DIAGNOSIS — G894 Chronic pain syndrome: Secondary | ICD-10-CM | POA: Diagnosis not present

## 2023-10-01 DIAGNOSIS — M419 Scoliosis, unspecified: Secondary | ICD-10-CM | POA: Diagnosis not present

## 2023-10-01 DIAGNOSIS — M47816 Spondylosis without myelopathy or radiculopathy, lumbar region: Secondary | ICD-10-CM | POA: Diagnosis not present

## 2023-10-01 DIAGNOSIS — M4727 Other spondylosis with radiculopathy, lumbosacral region: Secondary | ICD-10-CM | POA: Diagnosis not present

## 2023-10-01 DIAGNOSIS — Z981 Arthrodesis status: Secondary | ICD-10-CM | POA: Diagnosis not present

## 2023-10-29 DIAGNOSIS — M4727 Other spondylosis with radiculopathy, lumbosacral region: Secondary | ICD-10-CM | POA: Diagnosis not present

## 2023-10-29 DIAGNOSIS — G894 Chronic pain syndrome: Secondary | ICD-10-CM | POA: Diagnosis not present

## 2023-10-29 DIAGNOSIS — M47816 Spondylosis without myelopathy or radiculopathy, lumbar region: Secondary | ICD-10-CM | POA: Diagnosis not present

## 2023-10-29 DIAGNOSIS — Z981 Arthrodesis status: Secondary | ICD-10-CM | POA: Diagnosis not present

## 2023-12-15 ENCOUNTER — Ambulatory Visit: Payer: Medicare Other | Admitting: Allergy and Immunology

## 2023-12-15 ENCOUNTER — Other Ambulatory Visit: Payer: Self-pay | Admitting: Allergy and Immunology

## 2023-12-15 ENCOUNTER — Encounter: Payer: Self-pay | Admitting: Allergy and Immunology

## 2023-12-15 VITALS — BP 138/86 | HR 58 | Resp 18 | Ht 69.3 in | Wt 189.6 lb

## 2023-12-15 DIAGNOSIS — J3089 Other allergic rhinitis: Secondary | ICD-10-CM | POA: Diagnosis not present

## 2023-12-15 DIAGNOSIS — K219 Gastro-esophageal reflux disease without esophagitis: Secondary | ICD-10-CM | POA: Diagnosis not present

## 2023-12-15 MED ORDER — FLUTICASONE PROPIONATE 50 MCG/ACT NA SUSP: NASAL | 1 refills | Status: DC

## 2023-12-15 MED ORDER — IPRATROPIUM BROMIDE 0.06 % NA SOLN: NASAL | 5 refills | Status: AC

## 2023-12-15 NOTE — Patient Instructions (Addendum)
  1.  Continue montelukast  10mg  daily   2.  Continue astepro  - 2 sprays each nostril in AM  3. Continue flonase  - 2 sprays each nostril in PM  4.  Continue Pantoprazole  40 MG - 1 tablet 1-2 times per day  5. If needed:  A.  Cetirizine  10 mg - 1 tablet 1 time per day B.  Ipratropium 0.06% - 2 sprays each nostril every 6 hours to dry nose  6. Return to clinic in 1 year or earlier if problem  7. Influenza = Tamiflu. Covid = Paxlovid

## 2023-12-15 NOTE — Progress Notes (Unsigned)
 [place] - [place] - [place] - [place] - [place]   Follow-up Note  Referring Provider: Arloa Elsie SAUNDERS, MD Primary Provider: Arloa Elsie SAUNDERS, MD Date of Office Visit: 12/15/2023  Subjective:   Oscar Ward. (DOB: 1945-07-01) is a 78 y.o. male who [facility] in re-evaluation of the following:  HPI: Oscar Ward returns to this clinic in evaluation of allergic rhinitis and LPR.  I last saw him in this clinic 16 December 2022.  Overall he is done very well with his airway well using his Astepro  and Flonase  and montelukast  and he has not required a systemic steroid or an antibiotic for any type of airway issue.  He can breathe through his nose with no problem and he can smell with no problem.  He believes that his reflux is under good control while using pantoprazole  just 1 time per day.  He does occasionally have some drainage in the back of his throat which he does not think is reflux related.  He has received this years flu vaccine.  Since I have seen him in this clinic he has had a kidney stone which fortunately passed within 1 day.  Allergies as of 12/15/2023       Reactions   Bupivacaine Hcl Rash, Itching        Medication List    atorvastatin  20 MG tablet Commonly known as: LIPITOR Take 20 mg by mouth every evening.   azelastine  0.1 % nasal spray Commonly known as: ASTELIN  Place 1 spray into both nostrils 2 (two) times daily.   Azelastine  HCl 137 MCG/SPRAY Soln Use one spray in each nostril twice daily.   butalbital-acetaminophen -caffeine 50-325-40 MG tablet Commonly known as: FIORICET Take 1 tablet by mouth as needed for headache.   cetirizine  10 MG tablet Commonly known as: ZYRTEC  TAKE 1 TABLET BY MOUTH EVERYDAY AT BEDTIME   fluticasone  50 MCG/ACT nasal spray Commonly known as: FLONASE  SPRAY 1-2 SPRAYS EACH NOSTRIL 3-7 TIMES PER WEEK   HYDROcodone-acetaminophen  10-325 MG tablet Commonly known as:  NORCO TK 1 T PO Q 4 TO 6 H PRN P   IRON PO Take 325 mg by mouth daily.   losartan  100 MG tablet Commonly known as: COZAAR  Take 100 mg by mouth every evening.   montelukast  10 MG tablet Commonly known as: SINGULAIR  TAKE 1 TABLET BY MOUTH EVERYDAY AT BEDTIME   NASAL SALINE NA Place into the nose in the morning and at bedtime.   pantoprazole  40 MG tablet Commonly known as: PROTONIX  TAKE 1 TABLET BY MOUTH EVERY DAY    Past Medical History:  Diagnosis Date   Allergic rhinitis    Arthritis    Chronic kidney disease    only one kidney since bitth   Fibromyalgia    GERD (gastroesophageal reflux disease)    Headache    Hypertension    PONV (postoperative nausea and vomiting)     Past Surgical History:  Procedure Laterality Date   ADENOIDECTOMY     APPENDECTOMY     CERVICAL FUSION     CHOLECYSTECTOMY     COLON SURGERY     megacolon decreased    HEMORRHOID SURGERY  1985   HERNIA REPAIR     times 2   JOINT REPLACEMENT Left    knee   MENISCUS REPAIR Right 2011   PROSTATE SURGERY  2006   right testicle      removed   SHOULDER SURGERY Left  Bone Spur   SINOSCOPY     TONSILLECTOMY     TOTAL KNEE REVISION Left 10/15/2015   Procedure: LEFT TOTAL KNEE REVISION, synovectomy with poly exchange;  Surgeon: Lonni CINDERELLA Poli, MD;  Location: [facility] OR;  Service: Orthopedics;  Laterality: Left;    Review of systems negative except as noted in HPI / PMHx or noted below:  Review of Systems  Constitutional: Negative.   HENT: Negative.    Eyes: Negative.   Respiratory: Negative.    Cardiovascular: Negative.   Gastrointestinal: Negative.   Genitourinary: Negative.   Musculoskeletal: Negative.   Skin: Negative.   Neurological: Negative.   Endo/Heme/Allergies: Negative.   Psychiatric/Behavioral: Negative.       Objective:   Vitals:   12/15/23 1353 12/15/23 1407  BP: (!) 172/96 138/86  Pulse: (!) 58   Resp: 18   SpO2: 97%    Height: 5' 9.3 (176 cm)  Weight:  189 lb 9.6 oz (86 kg)   Physical Exam Constitutional:      Appearance: He is not diaphoretic.  HENT:     Head: Normocephalic.     Right Ear: Tympanic membrane, ear canal and external ear normal.     Left Ear: Tympanic membrane, ear canal and external ear normal.     Nose: Nose normal. No mucosal edema or rhinorrhea.     Mouth/Throat:     Pharynx: Uvula midline. No oropharyngeal exudate.  Eyes:     Conjunctiva/sclera: Conjunctivae normal.  Neck:     Thyroid : No thyromegaly.     Trachea: Trachea normal. No tracheal tenderness or tracheal deviation.  Cardiovascular:     Rate and Rhythm: Normal rate and regular rhythm.     Heart sounds: Normal heart sounds, S1 normal and S2 normal. No murmur heard. Pulmonary:     Effort: No respiratory distress.     Breath sounds: Normal breath sounds. No stridor. No wheezing or rales.  Lymphadenopathy:     Head:     Right side of head: No tonsillar adenopathy.     Left side of head: No tonsillar adenopathy.     Cervical: No cervical adenopathy.  Skin:    Findings: No erythema or rash.     Nails: There is no clubbing.  Neurological:     Mental Status: He is alert.     Diagnostics: none  Assessment and Plan:   1. Other allergic rhinitis   2. LPRD (laryngopharyngeal reflux disease)     1.  Continue montelukast  10mg  daily   2.  Continue astepro  - 2 sprays each nostril in AM  3. Continue flonase  - 2 sprays each nostril in PM  4.  Continue Pantoprazole  40 MG - 1 tablet 1-2 times per day  5. If needed:  A.  Cetirizine  10 mg - 1 tablet 1 time per day B.  Ipratropium 0.06% - 2 sprays each nostril every 6 hours to dry nose  6. Return to clinic in 1 year or earlier if problem  7. Influenza = Tamiflu. Covid = Paxlovid  Oscar Ward appears to be doing very well on his current plan and he will remain on montelukast  and Astepro  and Flonase  for his airway issue and he can add in some nasal ipratropium show to be required.  And his reflux appears to  be under good control using his proton pump inhibitor just 1 time per day.  Assuming he does well with this plan we will see him back in this clinic in 1 year or earlier  if there is a problem.  Camellia Denis, MD Allergy  / Immunology [facility]

## 2023-12-16 ENCOUNTER — Encounter: Payer: Self-pay | Admitting: Allergy and Immunology

## 2024-02-10 ENCOUNTER — Other Ambulatory Visit (INDEPENDENT_AMBULATORY_CARE_PROVIDER_SITE_OTHER)

## 2024-02-10 ENCOUNTER — Ambulatory Visit (INDEPENDENT_AMBULATORY_CARE_PROVIDER_SITE_OTHER): Admitting: Orthopaedic Surgery

## 2024-02-10 DIAGNOSIS — G8929 Other chronic pain: Secondary | ICD-10-CM

## 2024-02-10 DIAGNOSIS — M25562 Pain in left knee: Secondary | ICD-10-CM | POA: Diagnosis not present

## 2024-02-10 DIAGNOSIS — M25561 Pain in right knee: Secondary | ICD-10-CM | POA: Diagnosis not present

## 2024-02-10 DIAGNOSIS — Z96652 Presence of left artificial knee joint: Secondary | ICD-10-CM | POA: Diagnosis not present

## 2024-02-10 MED ORDER — LIDOCAINE HCL 1 % IJ SOLN
3.0000 mL | INTRAMUSCULAR | Status: AC | PRN
  Administered 2024-02-10: 3 mL

## 2024-02-10 MED ORDER — METHYLPREDNISOLONE ACETATE 40 MG/ML IJ SUSP
40.0000 mg | INTRAMUSCULAR | Status: AC | PRN
  Administered 2024-02-10: 40 mg via INTRA_ARTICULAR

## 2024-02-10 NOTE — Progress Notes (Signed)
 The patient is an 79 year old gentleman who is listed as a new patient because we have not seen him for a long period of time.  We asked replaced his left knee back in 2011 and then in 2017 did perform a polyliner exchange and upsize his poly.  He has been having some of left knee pain in the mornings on the medial aspect of his left knee but he is also been dealing with right knee pain.  This is causing him to have some back pain as well.  He otherwise has no acute changes in medical status.  He is retired now since 2014.  I did review his past medical history and medications within epic.  Examination of both knees shows no effusion of either knee.  Both knees hurt at the medial aspect of the knee with good range of motion.  His left operative knee is ligamentously stable as is the right knee.  X-rays standing of the left knee today show well-seated total knee arthroplasty with no complicating features or evidence of loosening.  The right knee does have arthritic changes of the medial and lateral compartments with some varus malalignment.  We decided to put a steroid injection in both knees today to see how this helps from a mobility standpoint and pain standpoint.  He tolerated these well.  Will see him back in a month to see how he is doing overall but no x-rays are needed.  If he still has left knee pain we would need to order a three-phase bone scan to rule out prosthetic loosening.    Procedure Note  Patient: Oscar Ward.             Date of Birth: 08-29-1945           MRN: [id number]             Visit Date: 02/10/2024  Procedures: Visit Diagnoses:  1. 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 Consent was given by the patient. Immediately prior to procedure a time out was called to verify the correct patient, procedure, equipment, support staff and site/side marked as required. Patient was prepped and draped in the usual sterile fashion.    Large Joint Inj: L knee on 02/10/2024 2:57 PM Indications: diagnostic evaluation and pain Details: 22 G 1.5 in needle, superolateral approach  Arthrogram: No  Medications: 3 mL lidocaine  1 %; 40 mg methylPREDNISolone  acetate 40 MG/ML Outcome: tolerated well, no immediate complications Procedure, treatment alternatives, risks and benefits explained, specific risks discussed. Consent was given by the patient. Immediately prior to procedure a time out was called to verify the correct patient, procedure, equipment, support staff and site/side marked as required. Patient was prepped and draped in the usual sterile fashion.

## 2024-03-09 ENCOUNTER — Ambulatory Visit: Admitting: Orthopaedic Surgery

## 2024-12-13 ENCOUNTER — Ambulatory Visit: Admitting: Allergy and Immunology
